# Patient Record
Sex: Female | Born: 1940 | Race: White | Hispanic: No | Marital: Single | Smoking: Never smoker
Health system: Southern US, Community
[De-identification: ages and names within clinical notes are randomized; demographics above are authoritative.]

## PROBLEM LIST (undated history)

## (undated) DIAGNOSIS — D72819 Decreased white blood cell count, unspecified: Secondary | ICD-10-CM

## (undated) DIAGNOSIS — K56600 Partial intestinal obstruction, unspecified as to cause: Secondary | ICD-10-CM

## (undated) DIAGNOSIS — M1990 Unspecified osteoarthritis, unspecified site: Secondary | ICD-10-CM

## (undated) DIAGNOSIS — N361 Urethral diverticulum: Secondary | ICD-10-CM

## (undated) DIAGNOSIS — M47816 Spondylosis without myelopathy or radiculopathy, lumbar region: Secondary | ICD-10-CM

## (undated) DIAGNOSIS — K219 Gastro-esophageal reflux disease without esophagitis: Secondary | ICD-10-CM

## (undated) DIAGNOSIS — R3129 Other microscopic hematuria: Secondary | ICD-10-CM

## (undated) DIAGNOSIS — E039 Hypothyroidism, unspecified: Secondary | ICD-10-CM

## (undated) DIAGNOSIS — N3941 Urge incontinence: Secondary | ICD-10-CM

## (undated) DIAGNOSIS — E079 Disorder of thyroid, unspecified: Secondary | ICD-10-CM

## (undated) DIAGNOSIS — N898 Other specified noninflammatory disorders of vagina: Secondary | ICD-10-CM

## (undated) DIAGNOSIS — K648 Other hemorrhoids: Secondary | ICD-10-CM

## (undated) DIAGNOSIS — I10 Essential (primary) hypertension: Secondary | ICD-10-CM

## (undated) DIAGNOSIS — M5136 Other intervertebral disc degeneration, lumbar region: Secondary | ICD-10-CM

## (undated) DIAGNOSIS — E7800 Pure hypercholesterolemia, unspecified: Secondary | ICD-10-CM

## (undated) DIAGNOSIS — R369 Urethral discharge, unspecified: Secondary | ICD-10-CM

## (undated) DIAGNOSIS — R42 Dizziness and giddiness: Secondary | ICD-10-CM

## (undated) HISTORY — DX: Other intervertebral disc degeneration, lumbar region: M51.36

## (undated) HISTORY — DX: Partial intestinal obstruction, unspecified as to cause: K56.600

## (undated) HISTORY — DX: Decreased white blood cell count, unspecified: D72.819

## (undated) HISTORY — DX: Urethral diverticulum: N36.1

## (undated) HISTORY — PX: BACK SURGERY: SHX140

## (undated) HISTORY — DX: Unspecified osteoarthritis, unspecified site: M19.90

## (undated) HISTORY — DX: Other microscopic hematuria: R31.29

## (undated) HISTORY — DX: Urethral discharge, unspecified: R36.9

## (undated) HISTORY — DX: Other specified noninflammatory disorders of vagina: N89.8

## (undated) HISTORY — PX: ABDOMINAL HYSTERECTOMY: SHX81

## (undated) HISTORY — DX: Urge incontinence: N39.41

## (undated) HISTORY — DX: Other hemorrhoids: K64.8

## (undated) HISTORY — PX: CHOLECYSTECTOMY: SHX55

## (undated) HISTORY — DX: Spondylosis without myelopathy or radiculopathy, lumbar region: M47.816

## (undated) HISTORY — DX: Disorder of thyroid, unspecified: E07.9

## (undated) HISTORY — DX: Pure hypercholesterolemia, unspecified: E78.00

## (undated) HISTORY — DX: Gastro-esophageal reflux disease without esophagitis: K21.9

---

## 2004-10-17 ENCOUNTER — Ambulatory Visit: Payer: Self-pay | Admitting: Unknown Physician Specialty

## 2006-10-09 ENCOUNTER — Ambulatory Visit: Payer: Self-pay | Admitting: Internal Medicine

## 2008-07-14 ENCOUNTER — Ambulatory Visit: Payer: Self-pay | Admitting: Unknown Physician Specialty

## 2013-05-18 DIAGNOSIS — R369 Urethral discharge, unspecified: Secondary | ICD-10-CM

## 2013-05-18 DIAGNOSIS — N3941 Urge incontinence: Secondary | ICD-10-CM

## 2013-05-18 DIAGNOSIS — N361 Urethral diverticulum: Secondary | ICD-10-CM

## 2013-05-18 HISTORY — DX: Urge incontinence: N39.41

## 2013-05-18 HISTORY — DX: Urethral diverticulum: N36.1

## 2013-05-18 HISTORY — DX: Urethral discharge, unspecified: R36.9

## 2013-06-09 ENCOUNTER — Ambulatory Visit: Payer: Self-pay | Admitting: Urology

## 2013-06-24 DIAGNOSIS — N898 Other specified noninflammatory disorders of vagina: Secondary | ICD-10-CM

## 2013-06-24 HISTORY — DX: Other specified noninflammatory disorders of vagina: N89.8

## 2013-08-03 ENCOUNTER — Ambulatory Visit: Payer: Self-pay | Admitting: Unknown Physician Specialty

## 2013-08-04 LAB — PATHOLOGY REPORT

## 2013-12-04 LAB — COMPREHENSIVE METABOLIC PANEL
ALBUMIN: 3.9 g/dL (ref 3.4–5.0)
ALK PHOS: 84 U/L
ALT: 28 U/L
AST: 19 U/L (ref 15–37)
Anion Gap: 8 (ref 7–16)
BUN: 14 mg/dL (ref 7–18)
Bilirubin,Total: 1 mg/dL (ref 0.2–1.0)
CALCIUM: 8.5 mg/dL (ref 8.5–10.1)
CHLORIDE: 106 mmol/L (ref 98–107)
Co2: 30 mmol/L (ref 21–32)
Creatinine: 0.85 mg/dL (ref 0.60–1.30)
EGFR (Non-African Amer.): >60
GLUCOSE: 92 mg/dL (ref 65–99)
Osmolality: 287 (ref 275–301)
Potassium: 3.1 mmol/L — ABNORMAL LOW (ref 3.5–5.1)
Sodium: 144 mmol/L (ref 136–145)
Total Protein: 7 g/dL (ref 6.4–8.2)

## 2013-12-04 LAB — LIPASE, BLOOD: LIPASE: 146 U/L (ref 73–393)

## 2013-12-04 LAB — URINALYSIS, COMPLETE
Bacteria: NONE SEEN
Bilirubin,UR: NEGATIVE
GLUCOSE, UR: NEGATIVE mg/dL (ref 0–75)
Ketone: NEGATIVE
Nitrite: NEGATIVE
Ph: 6 (ref 4.5–8.0)
Protein: NEGATIVE
SPECIFIC GRAVITY: 1.009 (ref 1.003–1.030)

## 2013-12-04 LAB — CBC
HCT: 42.3 % (ref 35.0–47.0)
HGB: 14.2 g/dL (ref 12.0–16.0)
MCH: 31.3 pg (ref 26.0–34.0)
MCHC: 33.6 g/dL (ref 32.0–36.0)
MCV: 93 fL (ref 80–100)
PLATELETS: 203 10*3/uL (ref 150–440)
RBC: 4.54 10*6/uL (ref 3.80–5.20)
RDW: 13.4 % (ref 11.5–14.5)
WBC: 6 10*3/uL (ref 3.6–11.0)

## 2013-12-04 LAB — TROPONIN I: Troponin-I: <0.02 ng/mL

## 2013-12-05 ENCOUNTER — Inpatient Hospital Stay: Payer: Self-pay | Admitting: Internal Medicine

## 2013-12-05 LAB — TROPONIN I
Troponin-I: 0.09 ng/mL — ABNORMAL HIGH
Troponin-I: 0.08 ng/mL — ABNORMAL HIGH

## 2013-12-05 LAB — MAGNESIUM: Magnesium: 1.7 mg/dL — ABNORMAL LOW

## 2013-12-05 LAB — CK-MB
CK-MB: 2 ng/mL (ref 0.5–3.6)
CK-MB: 2.4 ng/mL (ref 0.5–3.6)
CK-MB: 1.7 ng/mL (ref 0.5–3.6)

## 2013-12-05 LAB — POTASSIUM: Potassium: 4.3 mmol/L (ref 3.5–5.1)

## 2013-12-06 LAB — CBC WITH DIFFERENTIAL/PLATELET
BASOS ABS: 0 10*3/uL (ref 0.0–0.1)
Basophil %: 0.2 %
Eosinophil #: 0 10*3/uL (ref 0.0–0.7)
Eosinophil %: 0.1 %
HCT: 38.7 % (ref 35.0–47.0)
HGB: 13.3 g/dL (ref 12.0–16.0)
LYMPHS ABS: 0.4 10*3/uL — AB (ref 1.0–3.6)
Lymphocyte %: 6.6 %
MCH: 31.7 pg (ref 26.0–34.0)
MCHC: 34.4 g/dL (ref 32.0–36.0)
MCV: 92 fL (ref 80–100)
MONO ABS: 0.2 x10 3/mm (ref 0.2–0.9)
Monocyte %: 2.7 %
Neutrophil #: 5.4 10*3/uL (ref 1.4–6.5)
Neutrophil %: 90.4 %
Platelet: 208 10*3/uL (ref 150–440)
RBC: 4.19 10*6/uL (ref 3.80–5.20)
RDW: 13.3 % (ref 11.5–14.5)
WBC: 5.9 10*3/uL (ref 3.6–11.0)

## 2013-12-06 LAB — COMPREHENSIVE METABOLIC PANEL
ANION GAP: 6 — AB (ref 7–16)
Albumin: 3.2 g/dL — ABNORMAL LOW (ref 3.4–5.0)
Alkaline Phosphatase: 62 U/L
BILIRUBIN TOTAL: 1 mg/dL (ref 0.2–1.0)
BUN: 7 mg/dL (ref 7–18)
CHLORIDE: 109 mmol/L — AB (ref 98–107)
Calcium, Total: 8 mg/dL — ABNORMAL LOW (ref 8.5–10.1)
Co2: 27 mmol/L (ref 21–32)
Creatinine: 0.79 mg/dL (ref 0.60–1.30)
EGFR (Non-African Amer.): >60
Glucose: 162 mg/dL — ABNORMAL HIGH (ref 65–99)
Osmolality: 285 (ref 275–301)
Potassium: 4.4 mmol/L (ref 3.5–5.1)
SGOT(AST): 45 U/L — ABNORMAL HIGH (ref 15–37)
SGPT (ALT): 60 U/L
Sodium: 142 mmol/L (ref 136–145)
TOTAL PROTEIN: 6.1 g/dL — AB (ref 6.4–8.2)

## 2013-12-06 LAB — MAGNESIUM: MAGNESIUM: 2.3 mg/dL

## 2014-06-10 NOTE — Op Note (Signed)
PATIENT NAME:  Laurie [name], COTTAM MR#:  [id number] DATE OF BIRTH:  10-20-1940  DATE OF PROCEDURE:  12/05/2013  PREOPERATIVE DIAGNOSES: Acute cholecystitis, cholelithiasis.   POSTOPERATIVE DIAGNOSES: Acute cholecystitis, cholelithiasis.   PROCEDURE: Laparoscopic cholecystectomy, cholangiogram.   SURGEON: Dr. Renda Rolls.   ANESTHESIA: General.   INDICATIONS: This 74 year old female was admitted with acute onset of epigastric pain. She had ultrasound findings of gallstones. She had right upper quadrant tenderness. Surgery was recommended for definitive treatment.   DESCRIPTION OF PROCEDURE: The patient was placed on the operating table in the supine position under general endotracheal anesthesia. The abdomen was prepared with ChloraPrep and draped in a sterile manner. A short incision was made in the inferior aspect of the umbilicus and carried down to the deep fascia which was grasped with laryngeal hook and elevated. A Veress needle was inserted, aspirated, and irrigated with a saline solution. Next, the peritoneal cavity was inflated with carbon dioxide. The Veress needle was removed. The 10-mm cannula was inserted. The 10-mm 0-degree laparoscope was inserted to view the peritoneal cavity. The liver had a smooth surface and appeared that it may have some mild fatty infiltration. Survey of the rest of the abdomen did demonstrate there were a few adhesions between the omentum and the lower abdominal wall. Next, another incision was made in the epigastrium, slightly to the right of the midline to introduce an 11-mm cannula. Two incisions were made in the lateral aspect of the right upper quadrant to introduce two 5-mm cannulas.   The gallbladder was found to be markedly distended acutely inflamed and had a hemorrhagic appearance. It was decompressed with a lancing needle draining clear light green bile. Next, the gallbladder was retracted towards the right shoulder. The infundibulum was retracted  inferiorly and laterally. The gallbladder neck was mobilized with incision of the visceral peritoneum. There was edema in the wall of the gallbladder, which was extensive. The cystic duct was dissected free from surrounding structures. The cystic artery was dissected free from surrounding structures. The porta hepatis was noted to be covered with fat. A critical view of safety was demonstrated. An Endo Clip was placed across the cystic duct adjacent to the neck of the gallbladder. An incision was made in the cystic duct to introduce a Reddick catheter. Half-strength Conray-60 dye was injected as the cholangiogram was done with fluoroscopy. This demonstrated a mildly dilated bile duct. No stones were seen. There was prompt flow of dye into the duodenum. The Reddick catheter was removed. The cystic duct was doubly ligated with Endo Clips and divided. The cystic artery was controlled with double Endo Clips and divided. The gallbladder was dissected free from the liver with hook and cautery. There was extensive edema. Further noted hemostasis was intact. The gallbladder was placed into an Endo Catch bag and brought up through the infraumbilical port site, opened, and suctioned. A gallstone was removed in a piecemeal fashion. It was necessary to lengthen the skin incision and the fascial incision by about 6 mm and the gallbladder was removed and submitted in formalin with stones for routine pathology. The right upper quadrant was further inspected. Hemostasis was intact. The cannulas were removed. Carbon dioxide allowed to escape from the peritoneal cavity. The fascial defect at the umbilicus was closed with 0 Maxon figure-of-8 suture. The skin incisions were closed with interrupted 5-0 chromic subcuticular suture, benzoin, and Steri-Strips. Dressings were applied with paper tape. The patient tolerated surgery satisfactorily and was then prepared for transfer to the recovery  room.    ____________________________ Shela CommonsJ.  Renda RollsWilton Safia Panzer, MD jws:lt D: 12/05/2013 16:49:30 ET T: 12/05/2013 22:00:24 ET JOB#: [id number]  cc: Adella HareJ. Wilton Larsen Zettel, MD, <Dictator> Adella HareWILTON J Jakyiah Briones MD ELECTRONICALLY SIGNED 12/09/2013 19:45

## 2014-06-10 NOTE — Consult Note (Signed)
   Present Illness 74 yo female with no prior cardiac history who was admited after developing epigastric and ruq pain with radiation to her mid scapular region. SHe had significant nausea and vomiting with mildly elevated serum troponin of 0.09. EKG was unremarkable and echo revealed normal lv funciton with no wall motion abnormalities. Abdominal ct suggested acute cholecystitis.   Physical Exam:  GEN no acute distress   HEENT PERRL   NECK supple  No masses   RESP normal resp effort  clear BS   CARD Regular rate and rhythm   ABD positive tenderness  positive Flank Tenderness   LYMPH negative neck, negative axillae   EXTR negative cyanosis/clubbing, negative edema   SKIN normal to palpation   NEURO cranial nerves intact, motor/sensory function intact   PSYCH A+O to time, place, person   Review of Systems:  Subjective/Chief Complaint epigastric and abdominal pain   General: Fatigue   Skin: No Complaints   ENT: No Complaints   Eyes: No Complaints   Neck: No Complaints   Respiratory: No Complaints   Cardiovascular: No Complaints   Gastrointestinal: Heartburn  Nausea  abdominal pain   Genitourinary: No Complaints   Vascular: No Complaints   Musculoskeletal: No Complaints   Neurologic: No Complaints   Hematologic: No Complaints   Endocrine: No Complaints   Psychiatric: No Complaints   Review of Systems: All other systems were reviewed and found to be negative   Medications/Allergies Reviewed Medications/Allergies reviewed   EKG:  EKG NSR    No Known Allergies:    Impression 74 yo female with hisotry of no prior cardiac disease who was admitted after developing epigastric and ruq abdominal pain with radiation to her back. Mild tropoinin eelvation appears to be demand ischemia due to normal ekg and normal echo. Pain is likely secondary to acute cholescystitis. Pt is at low risk for surgery from cardiac standpoint and may proceed iwth routine cardiac  monitoring and without futher cardiac work up.   Plan 1. Proceed with cholecystectomy as planned with routine cardiac monitoring as patient appears to be at low risk from cardiac standpoint. 2. Emperic antibiotics. 3. Pt does not require further preop cardiac workup.   Electronic Signatures: Dalia HeadingFath, Annette Liotta A (MD)  (Signed 19-Oct-15 13:33)  Authored: General Aspect/Present Illness, History and Physical Exam, Review of System, EKG , Allergies, Impression/Plan   Last Updated: 19-Oct-15 13:33 by Dalia HeadingFath, Heitor Steinhoff A (MD)

## 2014-06-10 NOTE — H&P (Signed)
PATIENT NAME:  Tiburcio BashGERICKE, JUANITA D MR#:  [id number] DATE OF BIRTH:  [birth year]  DATE OF ADMISSION:  12/04/2013  PRIMARY CARE PHYSICIAN:  Curtis SitesBert J. Klein III, MD   CHIEF COMPLAINT:  Chest pain, abdominal pain.   HISTORY OF PRESENT ILLNESS:  Ms. Tressa BusmanGericke is a 74 year old female with history of hypertension, hyperlipidemia, and hypothyroidism, who comes to the Emergency Department with complaints of pain that started in the epigastric area and radiating into the right upper quadrant. It started at about 6 in the evening. Initially, the patient attributed this to gastroesophageal reflux disease and took some antacids and Pepcid without much improvement. The patient had 2 episodes of vomiting; the last one was with a small amount of blood. As the pain was getting worse, she came to the Emergency Department.   WORKUP IN THE EMERGENCY DEPARTMENT:  CT of the abdomen and pelvis shows no evidence of aortic aneurysm, inflammatory changes around the gallbladder suggesting early acute cholecystitis/cholelithiasis. The patient has normal white blood cell count and normal LFTs. However, on examination, the patient has a positive Murphy sign. On examination, the patient is also found to have mild elevation of the troponin of 0.09.   PAST MEDICAL HISTORY: 1.  Hypertension.  2.  Hyperlipidemia.  3.  Hypothyroidism.   PAST SURGICAL HISTORY: 1.  Back surgery.  2.  Hysterectomy.   ALLERGIES:  No known drug allergies.   HOME MEDICATIONS:   1.  Losartan 50 mg once a day.  2.  Levothyroxine 75 mcg once a day.  3.  Crestor 20 mg once a day.   SOCIAL HISTORY:  No history of smoking. Drinks alcohol once a week. Denies using any illicit drugs. Married and lives with her husband.   FAMILY HISTORY:  Mother died of colon cancer. Father died in World War II; otherwise, multiple family members with multiple cancers.   REVIEW OF SYSTEMS: CONSTITUTIONAL:  Denies any generalized weakness.  EYES:  No change in vision.   EARS, NOSE, AND THROAT:  No change in hearing.  RESPIRATORY:  No cough or shortness of breath.  CARDIOVASCULAR:  Has chest pain.  GASTROINTESTINAL:  Has nausea and pain in the epigastric and right upper quadrant area.  GENITOURINARY:  No dysuria or hematuria.  HEMATOLOGIC:  No easy bruising or bleeding.  SKIN:  No rash or lesions.  ENDOCRINE:  No polyuria or polydipsia.  NEUROLOGIC:  No weakness or numbness in any part of the body.   PHYSICAL EXAMINATION: GENERAL:  This is a well-built, well-nourished, age-appropriate female lying down in the bed, not in distress.  VITAL SIGNS:  Temperature 97.9, pulse 64, blood pressure 179/97, respiratory rate 16, oxygen saturation 100% on room air.  HEENT:  Head is normocephalic and atraumatic. There is no scleral icterus. Conjunctivae are normal. Pupils are equal and react to light. Mucous membranes are moist. No pharyngeal erythema.  NECK:  Supple. No lymphadenopathy. No JVD. No carotid bruit.  CHEST:  Has no focal tenderness.  LUNGS:  Bilaterally clear to auscultation.  HEART:  S1, S2 regular. No murmurs are heard.  ABDOMEN:  Bowel sounds present. Soft. Has pain in the epigastric area and right upper quadrant. Murphy sign positive. Has guarding. No rebound tenderness. I cannot appreciate hepatosplenomegaly.  EXTREMITIES:  No pedal edema. Pulses are 2+.  NEUROLOGIC:  The patient is alert and oriented to place, person, and time. Cranial nerves II through XII are intact. Motor is 5/5 in upper and lower extremities.   LABORATORY DATA:  CBC  and CMP are completely within normal limits. Troponin is less than 0.02. Urinalysis is negative for nitrites and leukocyte esterase.   IMAGING DATA:  CT of the abdomen and pelvis:  No evidence of aortic aneurysm or dissection. No significant vascular occlusion. Inflammatory changes around the gallbladder suggestive of early acute cholecystitis/cholelithiasis.    Ultrasound of the right upper quadrant positive for  gallstones. There is continued clinical suspicion for acute cholecystitis, recommended nuclear scan.   ASSESSMENT AND PLAN:  Ms. Roller is a 74 year old female who comes with chest pain and abdominal pain that started since this evening.   1.  Abdominal pain, highly concerning about acute cholecystitis; however, concerning about mild elevation of the troponin of 0.09. Admit the patient to a monitored bed. Continue to cycle cardiac enzymes x 3. Risk factors:  Age, hypertension, hyperlipidemia. Also, consult cardiology for clearance.  2.  Right upper quadrant pain, very highly concerning about acute cholecystitis. The patient has a positive Murphy sign. Consult general surgery.  3.  Hypertension. We will hold the lisinopril. We will continue to follow up. This could be secondary to pain.  4.  Hypothyroidism. Continue Synthroid.  5.  Keep the patient on deep vein thrombosis prophylaxis with Lovenox.   TIME SPENT:  50 minutes.    ____________________________ Susa Griffins, MD pv:nb D: 12/05/2013 04:31:08 ET T: 12/05/2013 04:57:30 ET JOB#: [id number]  cc: Susa Griffins, MD, <Dictator> Susa Griffins MD ELECTRONICALLY SIGNED 12/17/2013 23:20

## 2014-06-10 NOTE — Consult Note (Signed)
PATIENT NAME:  Laurie [name], Laurie [name] MR#:  [id number] DATE OF BIRTH:  Jun 26, 1940  DATE OF CONSULTATION:  12/05/2013  REFERRING PHYSICIAN:   CONSULTING PHYSICIAN:  Adella HareJ. Wilton Neema Fluegge, MD  HISTORY OF PRESENT ILLNESS: This 74 year old female accompanied by her husband and daughter was referred by Dr. Daniel NonesBert Klein for consultation regarding a chief complaint of epigastric pain. She reports she was well until yesterday evening when she developed acute epigastric pain in the mid epigastrium and right upper quadrant which has radiated through to the interscapular region of the back. The pain was fairly severe and persistent and did have nausea with an episode of vomiting. She reports no chills or fever. She has been voiding satisfactorily and moving her bowels satisfactorily. She was initially evaluated by the Emergency Room staff, had CT findings which raise concern about possible cholecystitis, also had an ultrasound which demonstrated gallstones. She had EKG which was normal.  She was given aspirin 81 mg. She was admitted emergently to the hospital and the telemetry unit. She reports persistence of pain. She has had further study with a echocardiogram which demonstrates good left ventricular ejection fraction.   PAST MEDICAL HISTORY:  1.  Hypertension.  2.  Hyperlipidemia.  3.  Hypothyroidism.   PAST SURGICAL HISTORY:   1.  Laparoscopic hysterectomy which included bilateral oophorectomy.  2.  Has had back surgery as well.   MEDICATIONS: Include losartan 50 mg daily, levothyroxine 75 mcg daily, Crestor 20 mg daily.   SOCIAL HISTORY: She is accompanied by her husband and daughter. Does not smoke. Drinks alcohol about once a week.   FAMILY HISTORY: Positive for colon cancer.   REVIEW OF SYSTEMS: She reports no other recent acute illness such as cough, cold, or sore throat. She reports no recent visual changes. No difficulty swallowing. Does have some history of reflux and heartburn. She reports no exertional  chest pains. No dyspnea.  No recent bowel symptoms. No urinary symptoms. No ankle edema. No recent sores or boils. Review of systems otherwise negative.   PHYSICAL EXAMINATION:  GENERAL: She is awake, alert, and oriented.  VITAL SIGNS: Temperature is 97.5, pulse 58, respirations 18, blood pressure 160/77, pulse oximetry 97%.  SKIN: Warm and dry without rash or jaundice.  HEENT: Pupils equal, reactive to light. Extraocular movements are intact. Sclerae clear. Pharynx clear.  NECK: No palpable mass.  LUNGS: Sounds were clear.  HEART: Regular rhythm, S1 and S2.  ABDOMEN:  Mild right upper quadrant tenderness with positive Murphy sign. No palpable mass. No hepatomegaly.  EXTREMITIES: No pedal edema.  NEUROLOGIC: Awake, alert, oriented, moving all extremities.   CLINICAL DATA: Her potassium on admission was 3.1, subsequent potassium was up to 4.3, magnesium is low at 1.7. Liver panel normal. Creatinine 0.85. Troponin is 0.08. CBC with a white blood count 6000, hemoglobin 14.2, platelet count 203,000. Echocardiogram with minimal mitral regurgitation.   IMPRESSION: Cholecystitis, cholelithiasis.   PLAN:  I recommended laparoscopic cholecystectomy. I have discussed the operation, care, risks, and benefits with her in detail and we will get this scheduled.     ____________________________ Shela CommonsJ. Renda RollsWilton Hellen Shanley, MD jws:bu [name]: 12/05/2013 13:17:46 ET T: 12/05/2013 14:30:20 ET JOB#: [id number]  cc: Adella HareJ. Wilton Harkirat Orozco, MD, <Dictator> Adella HareWILTON J Deliyah Muckle MD ELECTRONICALLY SIGNED 12/09/2013 19:44

## 2014-06-12 LAB — SURGICAL PATHOLOGY

## 2015-09-24 DIAGNOSIS — M47816 Spondylosis without myelopathy or radiculopathy, lumbar region: Secondary | ICD-10-CM

## 2015-09-24 HISTORY — DX: Spondylosis without myelopathy or radiculopathy, lumbar region: M47.816

## 2015-10-13 IMAGING — CT CT ANGIO CHEST-ABD-PELV
2 of 6 series · 11 of 36 positions shown, 15 images · IV contrast (APPLIED)
Comparison: None.

CLINICAL DATA: Mid upper abdominal and epigastric region pain
radiating to the back and right upper quadrant abdomen. Vomiting.

EXAM:
CT ANGIOGRAPHY CHEST, ABDOMEN AND PELVIS
TECHNIQUE: Multidetector CT imaging through the chest, abdomen and pelvis was
performed using the standard protocol during bolus administration of
intravenous contrast. Multiplanar reconstructed images and MIPs were
obtained and reviewed to evaluate the vascular anatomy.
CONTRAST:  125 mL Isovue 370

[Series 6: arterial · axial · arterial · 0.73mm/px · z∈[-665,-111]mm · 10 of 322 slices shown, 13 images]
[im 30/322  mediastinal]
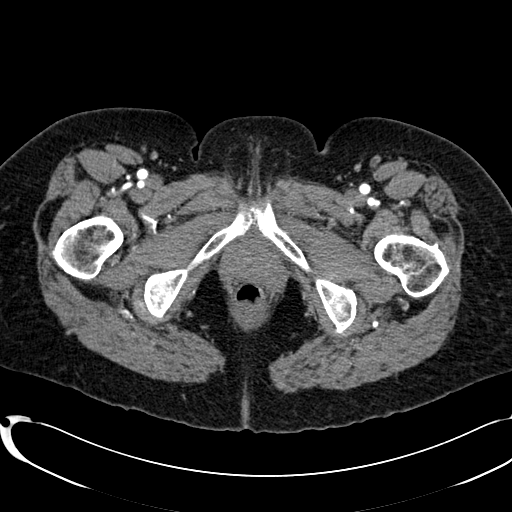
[im 30/322  bone]
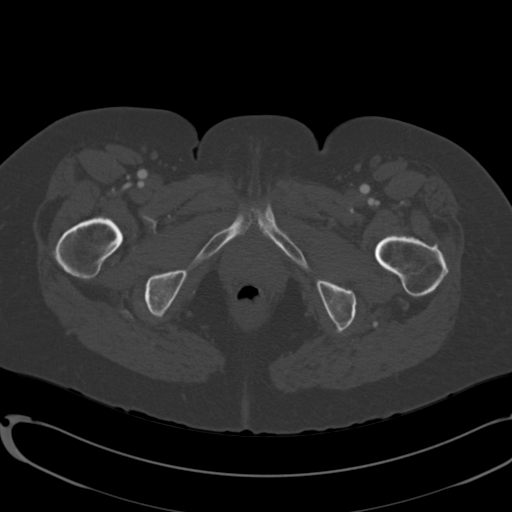
[im 59/322  mediastinal]
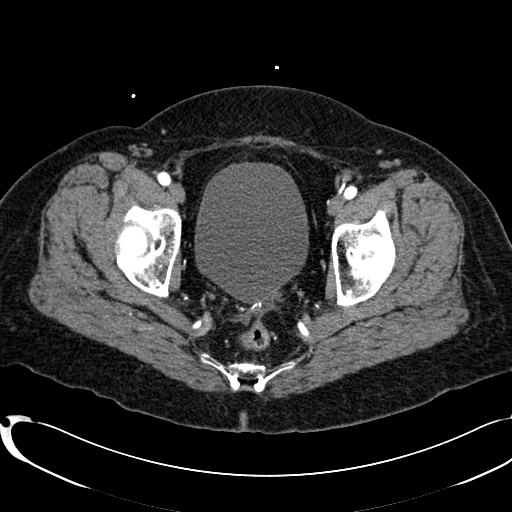
[im 103/322  mediastinal]
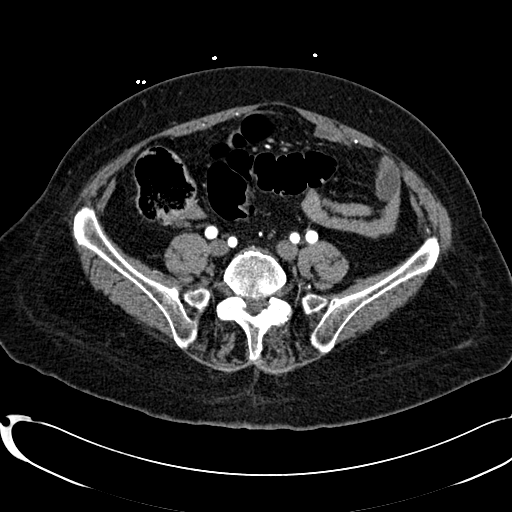
[im 146/322  mediastinal]
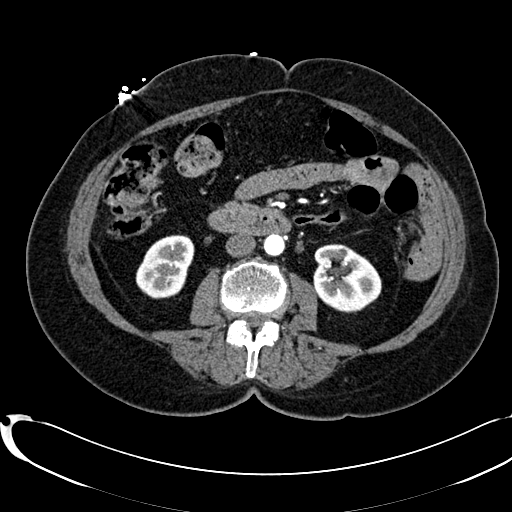
[im 176/322  mediastinal]
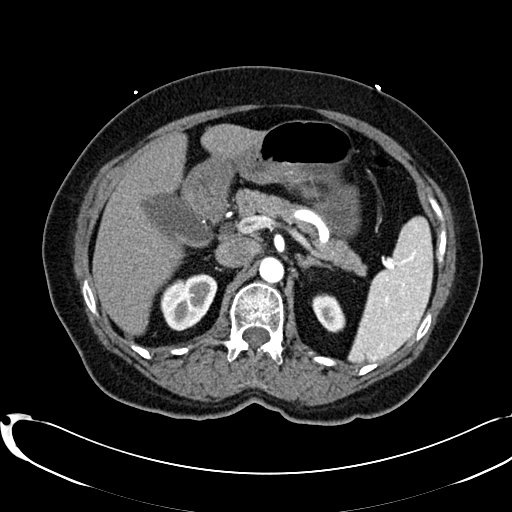
[im 219/322  mediastinal]
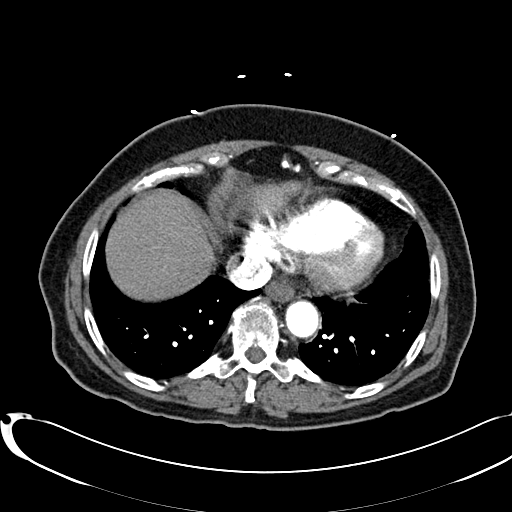
[im 263/322  mediastinal]
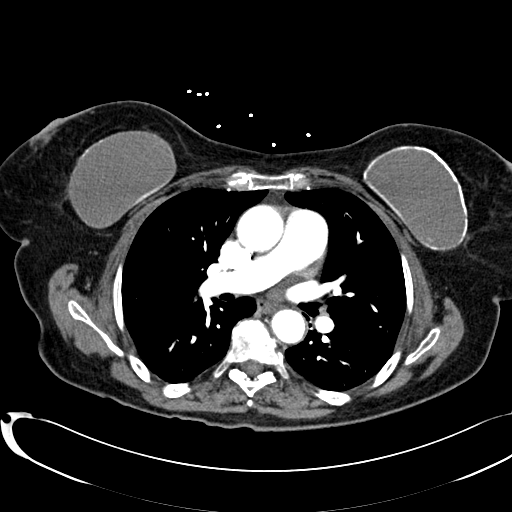
[im 263/322  lung]
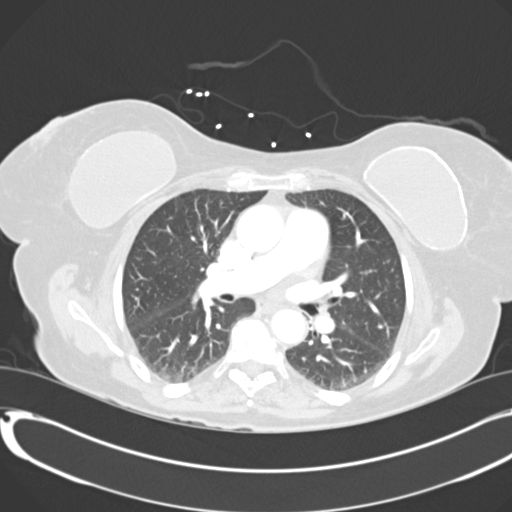
[im 278/322  lung]
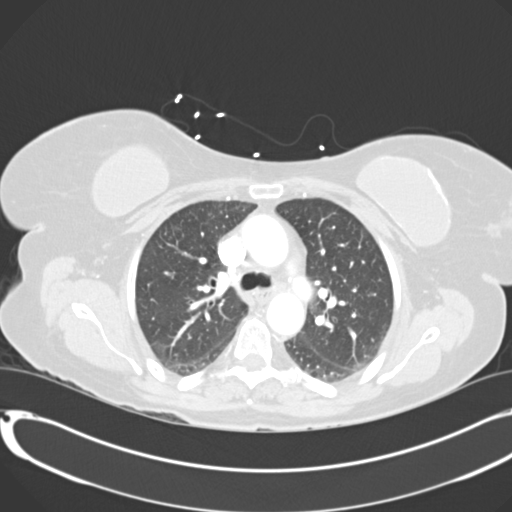
[im 292/322  mediastinal]
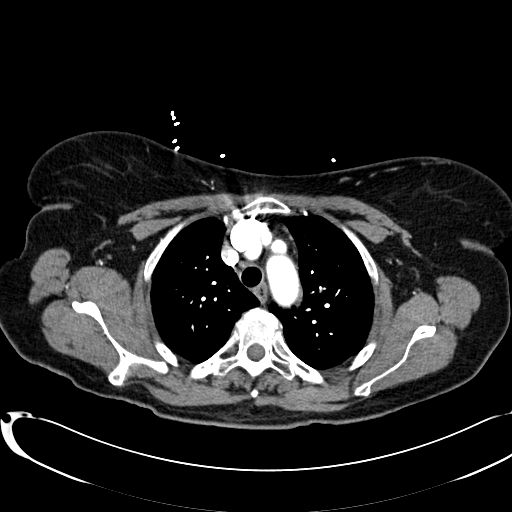
[im 292/322  lung]
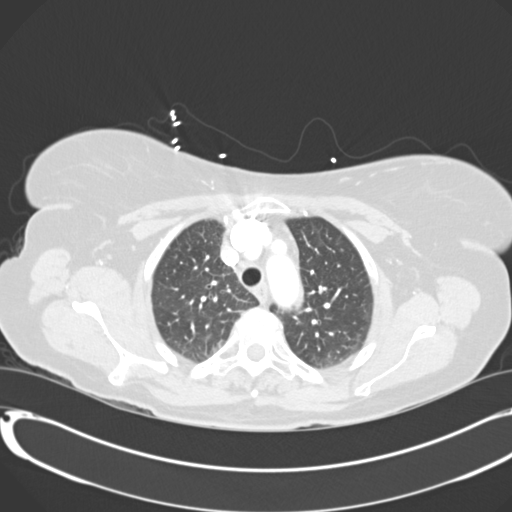
[im 307/322  lung]
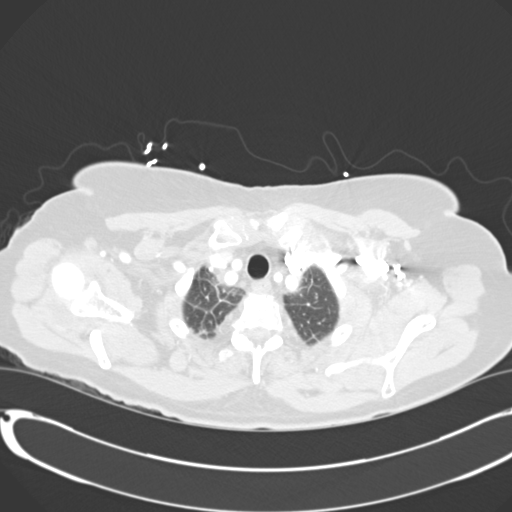

[Series 8: cor arterial mpr · coronal · arterial · 0.73mm/px · 1 of 119 slices shown, 2 images]
[im 60/119  mediastinal]
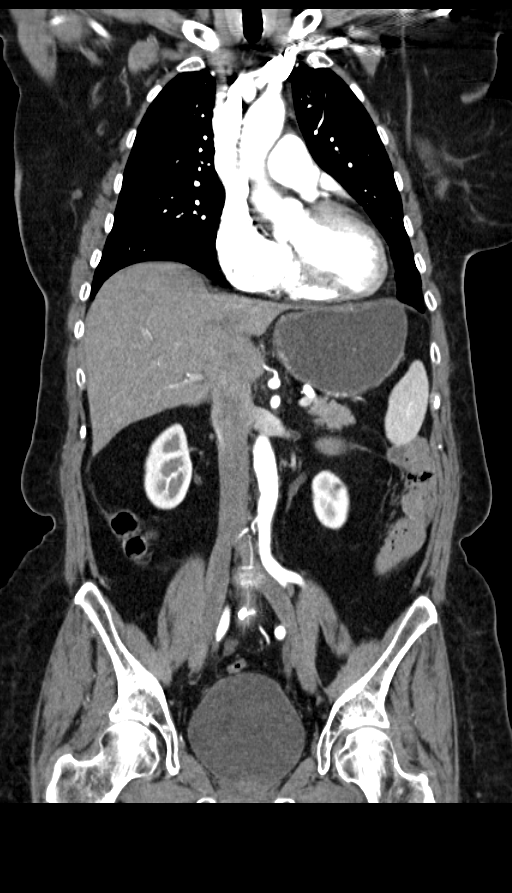
[im 60/119  bone]
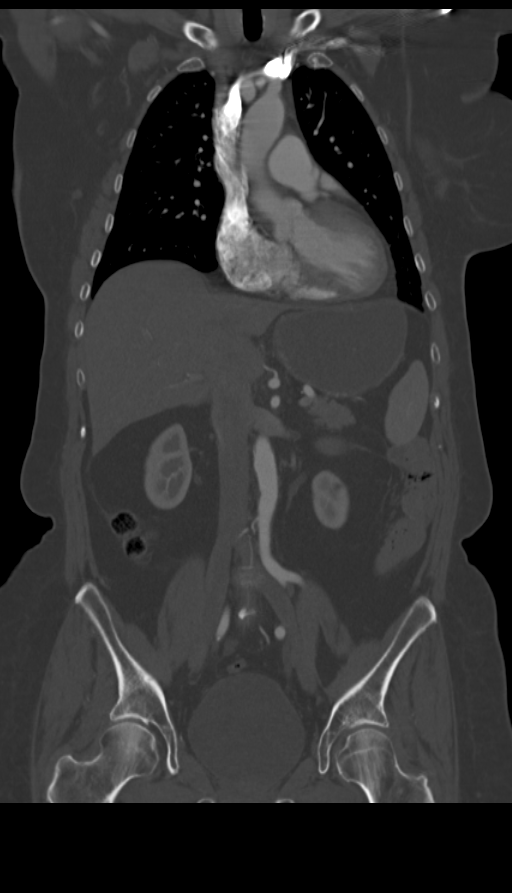

[11 of 36 positions shown; findings below may reference images not displayed]

FINDINGS: CTA CHEST FINDINGS

Unenhanced images of the chest demonstrate scattered calcification
in the thoracic aorta. No intramural hematoma. Small calcified lymph
nodes in the mediastinum. Calcified granulomas in the right lung.
Bilateral breast implants with calcification of the implant walls.

Arterial phase contrast enhanced images demonstrate normal
opacification of the thoracic aorta. No evidence of aneurysm or
dissection. Great vessel origins are patent. Visualized central
pulmonary arteries are patent. No evidence of significant pulmonary
embolus.

Normal heart size. Esophagus is decompressed. Small esophageal
hiatal hernia. Dependent atelectasis in the lungs. No focal airspace
disease or consolidation. Airways appear patent. No significant
lymphadenopathy in the chest.

Review of the MIP images confirms the above findings.

CTA ABDOMEN AND PELVIS FINDINGS

Normal caliber abdominal aorta. No evidence of aortic dissection.
The abdominal aorta, celiac axis, superior mesenteric artery, single
bilateral renal arteries, inferior mesenteric artery, and bilateral
iliac, external iliac, internal iliac, and common femoral arteries
are patent. No evidence of significant vascular occlusion. Renal
nephrograms are symmetrical.

Diffuse fatty infiltration of the liver. The gallbladder is mildly
distended with a large stone in the gallbladder. There is mild
infiltration around the gallbladder. This suggest mild changes of
acute cholecystitis. No bile duct dilatation. The pancreas, spleen,
adrenal glands, inferior vena cava, and retroperitoneal lymph nodes
are unremarkable. Subcentimeter cysts in the right kidney. No
hydronephrosis or solid mass in either kidney. Fluid filled stomach
without wall thickening. Small bowel are decompressed. Stool-filled
colon without distention. No free air or free fluid in the abdomen.
Abdominal wall musculature appears intact.

Pelvis: Surgical absence of the uterus. No abnormal pelvic mass or
lymphadenopathy. No inflammatory changes to suggest diverticulitis.
Appendix is normal. No free or loculated pelvic fluid collections.

Bones: Degenerative changes throughout the thoracic and lumbar
spine. No vertebral compression deformities. Sternum and visualized
ribs appear intact. Degenerative changes in the hips. No displaced
fractures identified in the pelvis or sacrum.

Review of the MIP images confirms the above findings.
IMPRESSION: No evidence of aortic aneurysm or dissection. No significant
vascular occlusion. Of incidental note of inflammatory changes
around the gallbladder suggesting early acute cholecystitis.
Cholelithiasis. Small esophageal hiatal hernia.

## 2015-10-13 IMAGING — CR DG CHOLANGIOGRAM OPERATIVE
3 series · 3 of 3 positions shown · non-contrast
Comparison: CT the chest, abdomen pelvis -12/05/2013

CLINICAL DATA: Laparoscopic cholecystectomy.

EXAM:
INTRAOPERATIVE CHOLANGIOGRAM
FLUOROSCOPY TIME:  4 seconds

[[id] (1 of 3)]
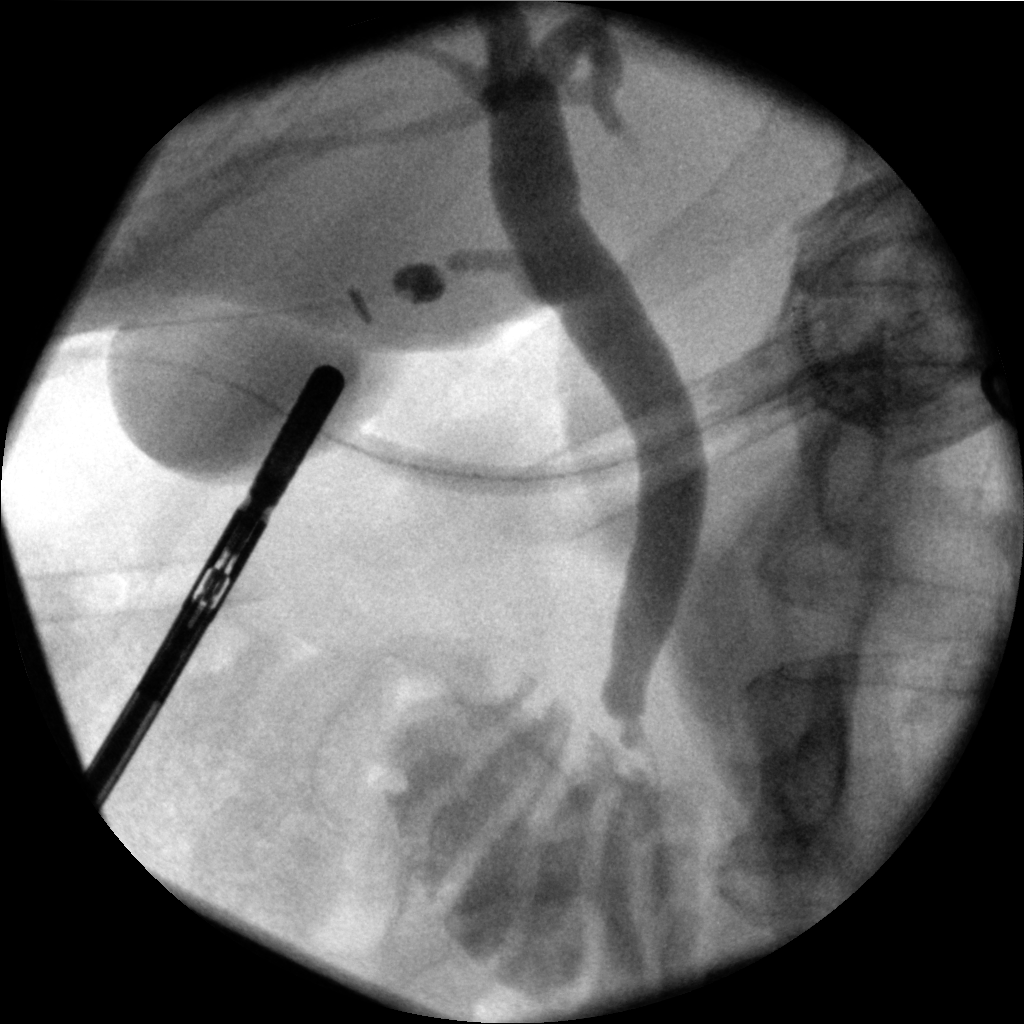

[[id] (2 of 3)]
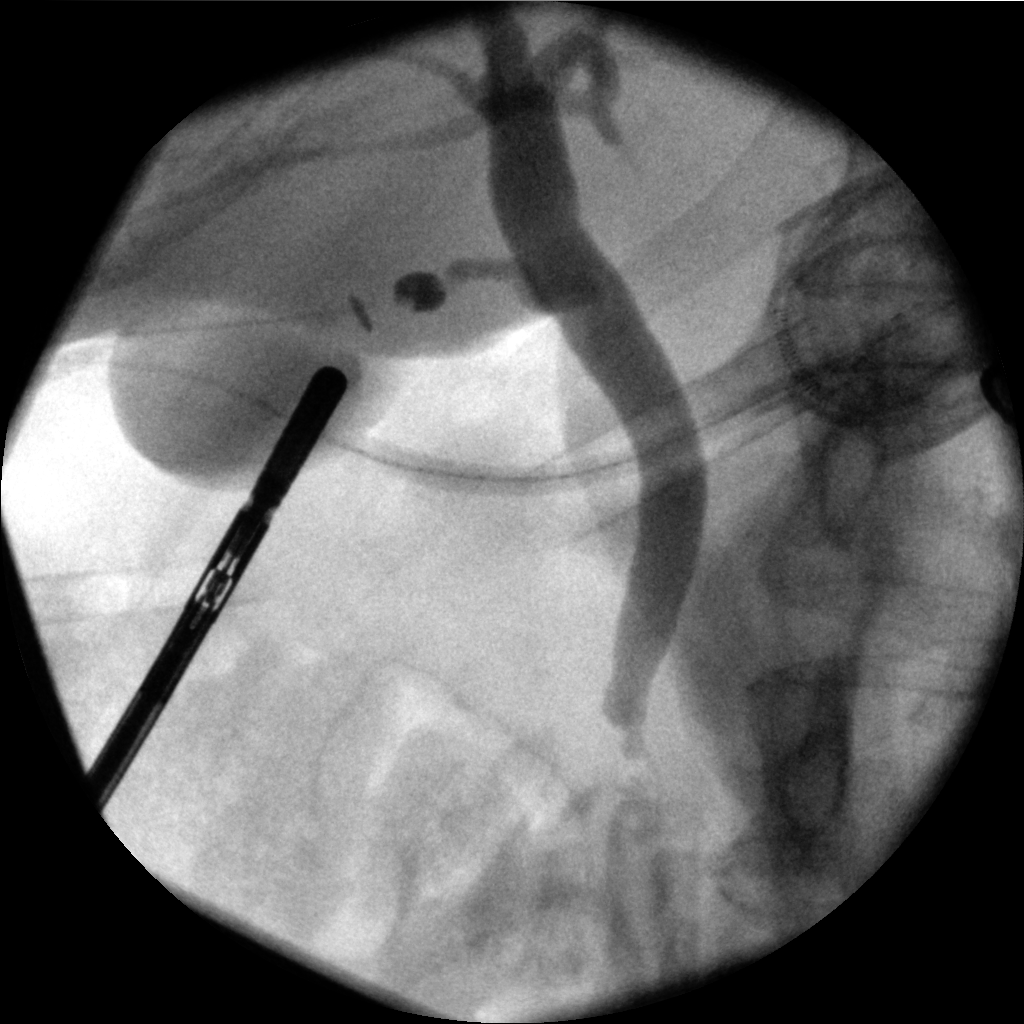

[[id] (3 of 3)]
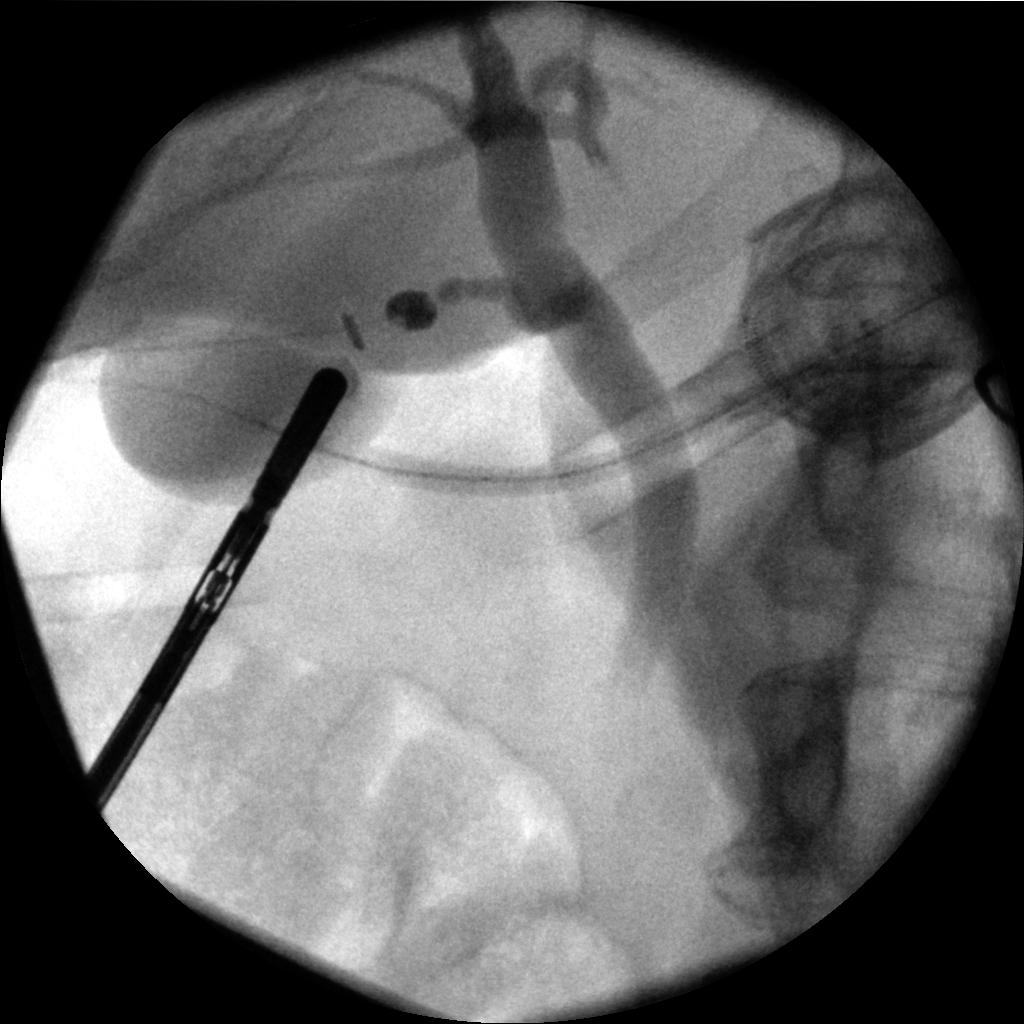

[3 of 3 positions shown; findings below may reference images not displayed]

FINDINGS: Three intraoperative cholangiographic images of the right upper
abdominal quadrant during laparoscopic cholecystectomy are provided
for review.

Surgical clips overlie the expected location of the gallbladder
fossa.

Contrast injection demonstrates selective cannulation of the central
aspect of the cystic duct.

There is passage of contrast through the central aspect of the
cystic duct with filling of a non dilated common bile duct. There is
passage of contrast though the CBD and into the descending portion
of the duodenum.

There is minimal reflux of injected contrast into the common hepatic
duct and central aspect of the non dilated intrahepatic biliary
system.

There are no discrete filling defects within the opacified portions
of the biliary system to suggest the presence of
choledocholithiasis.
IMPRESSION: No evidence of choledocholithiasis.

## 2016-12-11 ENCOUNTER — Encounter: Payer: Self-pay | Admitting: Emergency Medicine

## 2016-12-11 ENCOUNTER — Inpatient Hospital Stay
Admission: EM | Admit: 2016-12-11 | Discharge: 2016-12-15 | DRG: 390 | Disposition: A | Discharge status: Home / Self Care | Payer: Medicare Other | Attending: General Surgery | Admitting: General Surgery

## 2016-12-11 ENCOUNTER — Emergency Department: Payer: Medicare Other

## 2016-12-11 DIAGNOSIS — E785 Hyperlipidemia, unspecified: Secondary | ICD-10-CM | POA: Diagnosis present

## 2016-12-11 DIAGNOSIS — K56600 Partial intestinal obstruction, unspecified as to cause: Secondary | ICD-10-CM

## 2016-12-11 DIAGNOSIS — E039 Hypothyroidism, unspecified: Secondary | ICD-10-CM | POA: Diagnosis present

## 2016-12-11 DIAGNOSIS — Z86010 Personal history of colonic polyps: Secondary | ICD-10-CM

## 2016-12-11 DIAGNOSIS — K219 Gastro-esophageal reflux disease without esophagitis: Secondary | ICD-10-CM | POA: Diagnosis present

## 2016-12-11 DIAGNOSIS — Z888 Allergy status to other drugs, medicaments and biological substances status: Secondary | ICD-10-CM

## 2016-12-11 DIAGNOSIS — K5652 Intestinal adhesions [bands] with complete obstruction: Secondary | ICD-10-CM

## 2016-12-11 DIAGNOSIS — M1990 Unspecified osteoarthritis, unspecified site: Secondary | ICD-10-CM | POA: Diagnosis present

## 2016-12-11 DIAGNOSIS — I10 Essential (primary) hypertension: Secondary | ICD-10-CM | POA: Diagnosis present

## 2016-12-11 DIAGNOSIS — Z90710 Acquired absence of both cervix and uterus: Secondary | ICD-10-CM

## 2016-12-11 DIAGNOSIS — Z79899 Other long term (current) drug therapy: Secondary | ICD-10-CM | POA: Diagnosis not present

## 2016-12-11 DIAGNOSIS — R1030 Lower abdominal pain, unspecified: Secondary | ICD-10-CM | POA: Diagnosis present

## 2016-12-11 DIAGNOSIS — K56601 Complete intestinal obstruction, unspecified as to cause: Secondary | ICD-10-CM | POA: Diagnosis not present

## 2016-12-11 HISTORY — DX: Essential (primary) hypertension: I10

## 2016-12-11 HISTORY — DX: Partial intestinal obstruction, unspecified as to cause: K56.600

## 2016-12-11 HISTORY — DX: Hypothyroidism, unspecified: E03.9

## 2016-12-11 LAB — COMPREHENSIVE METABOLIC PANEL
ALK PHOS: 62 U/L (ref 38–126)
ALT: 21 U/L (ref 14–54)
AST: 26 U/L (ref 15–41)
Albumin: 4.7 g/dL (ref 3.5–5.0)
Anion gap: 10 (ref 5–15)
BILIRUBIN TOTAL: 1.4 mg/dL — AB (ref 0.3–1.2)
BUN: 12 mg/dL (ref 6–20)
CALCIUM: 9.7 mg/dL (ref 8.9–10.3)
CO2: 29 mmol/L (ref 22–32)
CREATININE: 0.88 mg/dL (ref 0.44–1.00)
Chloride: 100 mmol/L — ABNORMAL LOW (ref 101–111)
Glucose, Bld: 116 mg/dL — ABNORMAL HIGH (ref 65–99)
Potassium: 4 mmol/L (ref 3.5–5.1)
SODIUM: 139 mmol/L (ref 135–145)
TOTAL PROTEIN: 7.4 g/dL (ref 6.5–8.1)

## 2016-12-11 LAB — CBC
HCT: 44.1 % (ref 35.0–47.0)
Hemoglobin: 14.7 g/dL (ref 12.0–16.0)
MCH: 30.9 pg (ref 26.0–34.0)
MCHC: 33.3 g/dL (ref 32.0–36.0)
MCV: 92.6 fL (ref 80.0–100.0)
PLATELETS: 220 10*3/uL (ref 150–440)
RBC: 4.77 MIL/uL (ref 3.80–5.20)
RDW: 14.2 % (ref 11.5–14.5)
WBC: 6.1 10*3/uL (ref 3.6–11.0)

## 2016-12-11 LAB — URINALYSIS, COMPLETE (UACMP) WITH MICROSCOPIC
Bilirubin Urine: NEGATIVE
Glucose, UA: NEGATIVE mg/dL
Ketones, ur: NEGATIVE mg/dL
NITRITE: NEGATIVE
PH: 7 (ref 5.0–8.0)
Protein, ur: NEGATIVE mg/dL
SPECIFIC GRAVITY, URINE: 1.016 (ref 1.005–1.030)

## 2016-12-11 LAB — LACTIC ACID, PLASMA: Lactic Acid, Venous: 1 mmol/L (ref 0.5–1.9)

## 2016-12-11 LAB — LIPASE, BLOOD: Lipase: 19 U/L (ref 11–51)

## 2016-12-11 MED ORDER — MORPHINE SULFATE (PF) 4 MG/ML IV SOLN
INTRAVENOUS | Status: AC
  Administered 2016-12-11: 4 mg via INTRAVENOUS
  Filled 2016-12-11: qty 1

## 2016-12-11 MED ORDER — IOPAMIDOL (ISOVUE-300) INJECTION 61%
100.0000 mL | Freq: Once | INTRAVENOUS | Status: AC | PRN
  Administered 2016-12-11: 100 mL via INTRAVENOUS

## 2016-12-11 MED ORDER — HYDRALAZINE HCL 20 MG/ML IJ SOLN
10.0000 mg | Freq: Four times a day (QID) | INTRAMUSCULAR | Status: DC | PRN
  Administered 2016-12-11: 10 mg via INTRAVENOUS
  Filled 2016-12-11: qty 1

## 2016-12-11 MED ORDER — SODIUM CHLORIDE 0.9 % IV SOLN
Freq: Once | INTRAVENOUS | Status: AC
  Administered 2016-12-11: 19:00:00 via INTRAVENOUS

## 2016-12-11 MED ORDER — ONDANSETRON HCL 4 MG/2ML IJ SOLN
4.0000 mg | Freq: Four times a day (QID) | INTRAMUSCULAR | Status: DC | PRN
  Administered 2016-12-11 – 2016-12-12 (×2): 4 mg via INTRAVENOUS
  Filled 2016-12-11 (×2): qty 2

## 2016-12-11 MED ORDER — PANTOPRAZOLE SODIUM 40 MG IV SOLR
40.0000 mg | Freq: Every day | INTRAVENOUS | Status: DC
  Administered 2016-12-11 – 2016-12-14 (×4): 40 mg via INTRAVENOUS
  Filled 2016-12-11 (×4): qty 40

## 2016-12-11 MED ORDER — ONDANSETRON HCL 4 MG/2ML IJ SOLN
INTRAMUSCULAR | Status: AC
Start: 2016-12-11 — End: 2016-12-11
  Administered 2016-12-11: 4 mg via INTRAVENOUS
  Filled 2016-12-11: qty 2

## 2016-12-11 MED ORDER — IOPAMIDOL (ISOVUE-300) INJECTION 61%
30.0000 mL | Freq: Once | INTRAVENOUS | Status: AC
  Administered 2016-12-11: 30 mL via ORAL

## 2016-12-11 MED ORDER — HYDROMORPHONE HCL 1 MG/ML IJ SOLN: 0.5000 mg | INTRAMUSCULAR | Status: DC | PRN

## 2016-12-11 MED ORDER — LACTATED RINGERS IV SOLN
INTRAVENOUS | Status: DC
Start: 2016-12-11 — End: 2016-12-13
  Administered 2016-12-11 – 2016-12-13 (×3): via INTRAVENOUS

## 2016-12-11 MED ORDER — LEVOTHYROXINE SODIUM 100 MCG IV SOLR
37.5000 ug | Freq: Every day | INTRAVENOUS | Status: DC
  Administered 2016-12-12 – 2016-12-15 (×4): 37.5 ug via INTRAVENOUS
  Filled 2016-12-11 (×4): qty 5

## 2016-12-11 MED ORDER — MORPHINE SULFATE (PF) 4 MG/ML IV SOLN
INTRAVENOUS | Status: AC
  Filled 2016-12-11: qty 1

## 2016-12-11 MED ORDER — ONDANSETRON 4 MG PO TBDP: 4.0000 mg | ORAL_TABLET | Freq: Four times a day (QID) | ORAL | Status: DC | PRN

## 2016-12-11 MED ORDER — MORPHINE SULFATE (PF) 4 MG/ML IV SOLN
4.0000 mg | Freq: Once | INTRAVENOUS | Status: AC
  Administered 2016-12-11: 4 mg via INTRAVENOUS

## 2016-12-11 MED ORDER — ONDANSETRON HCL 4 MG/2ML IJ SOLN
4.0000 mg | Freq: Once | INTRAMUSCULAR | Status: AC
  Administered 2016-12-11: 4 mg via INTRAVENOUS

## 2016-12-11 MED ORDER — ENOXAPARIN SODIUM 40 MG/0.4ML ~~LOC~~ SOLN
40.0000 mg | SUBCUTANEOUS | Status: DC
  Administered 2016-12-12: 40 mg via SUBCUTANEOUS
  Filled 2016-12-11 (×2): qty 0.4

## 2016-12-11 MED ORDER — KETOROLAC TROMETHAMINE 15 MG/ML IJ SOLN
15.0000 mg | Freq: Four times a day (QID) | INTRAMUSCULAR | Status: DC | PRN
  Administered 2016-12-11: 15 mg via INTRAVENOUS
  Filled 2016-12-11 (×2): qty 1

## 2016-12-11 NOTE — ED Provider Notes (Signed)
[facility] Emergency Department Provider Note   ____________________________________________   I have reviewed the triage vital signs and the nursing notes.   HISTORY  Chief Complaint Abdominal Pain   History limited by: Not Limited   HPI Laurie [name] is a 76 y.o. female who presents to the emergency department today because of concern for abdominal pain.  LOCATION:initially lower abdomen, at time of my exam more in the upper abdomen DURATION:started this afternoon TIMING: has been constant and worsening SEVERITY: severe QUALITY: burning, cramping CONTEXT: patient states she was outside doing yard work when the pain started. She had been bending over and weeding. Had a normal bowel movement earlier in the day. Had a small bowel movement after the pain started.  MODIFYING FACTORS: tried drinking a carbonated beverage which did not help ASSOCIATED SYMPTOMS: patient feels she has had some distention. no nausea or emesis. No fevers.   Per medical record review patient has a history of hysterectomy and cholecystectomy.  Past Medical History:  Diagnosis Date  . Hypertension   . Hypothyroid     There are no active problems to display for this patient.   Past Surgical History:  Procedure Laterality Date  . ABDOMINAL HYSTERECTOMY    . BACK SURGERY    . CHOLECYSTECTOMY      Prior to Admission medications   Not on File    Allergies Patient has no known allergies.  No family history on file.  Social History Social History  Substance Use Topics  . Smoking status: Never Smoker  . Smokeless tobacco: Never Used  . Alcohol use Yes    Review of Systems Constitutional: No fever/chills Eyes: No visual changes. ENT: No sore throat. Cardiovascular: Denies chest pain. Respiratory: Denies shortness of breath. Gastrointestinal: Positive for abdominal pain. Genitourinary: Negative for dysuria. Musculoskeletal: Negative for back pain. Skin:  Negative for rash. Neurological: Negative for headaches, focal weakness or numbness.  ____________________________________________   PHYSICAL EXAM:  VITAL SIGNS: ED Triage Vitals  Enc Vitals Group     BP 12/11/16 1743 (!) 183/107     Pulse Rate 12/11/16 1743 95     Resp 12/11/16 1743 18     Temp 12/11/16 1743 97.6 F (36.4 C)     Temp Source 12/11/16 1743 Oral     SpO2 12/11/16 1743 96 %     Weight 12/11/16 1744 160 lb (72.6 kg)     Height 12/11/16 1744 5\' 3"  (1.6 m)     Head Circumference --      Peak Flow --      Pain Score 12/11/16 1743 9    Constitutional: Alert and oriented. Well appearing and in no distress. Eyes: Conjunctivae are normal.  ENT   Head: Normocephalic and atraumatic.   Nose: No congestion/rhinnorhea.   Mouth/Throat: Mucous membranes are moist.   Neck: No stridor. Hematological/Lymphatic/Immunilogical: No cervical lymphadenopathy. Cardiovascular: Normal rate, regular rhythm.  No murmurs, rubs, or gallops.  Respiratory: Normal respiratory effort without tachypnea nor retractions. Breath sounds are clear and equal bilaterally. No wheezes/rales/rhonchi. Gastrointestinal: Soft. No tympany. Tender to palpation, primarily in the upper abdomen.  Genitourinary: Deferred Musculoskeletal: Normal range of motion in all extremities. No lower extremity edema. Neurologic:  Normal speech and language. No gross focal neurologic deficits are appreciated.  Skin:  Skin is warm, dry and intact. No rash noted. Psychiatric: Mood and affect are normal. Speech and behavior are normal. Patient exhibits appropriate insight and judgment.  ____________________________________________    LABS (pertinent positives/negatives)  Lactic acid 1.0 Lipase 19 CMP tot b 1.4, cl 100, glu 116 otherwise wnl UA WBC too numerous to count CBC wbc 6.1, hgb 14.7  ____________________________________________   EKG  I, Phineas SemenGraydon Cyncere Sontag, attending physician, personally viewed  and interpreted this EKG  EKG Time: 1752 Rate: 72 Rhythm: normal sinus rhythm Axis: normal Intervals: qtc 398 QRS: narrow ST changes: no st elevation, t wave inversion V1, V2 Impression: abnormal ekg   ____________________________________________    RADIOLOGY  CT abd/pel Concern for possible early partial obstruction   ____________________________________________   PROCEDURES  Procedures  ____________________________________________   INITIAL IMPRESSION / ASSESSMENT AND PLAN / ED COURSE  Pertinent labs & imaging results that were available during my care of the patient were reviewed by me and considered in my medical decision making (see chart for details).  Differential diagnosis includes, but is not limited to, acute appendicitis, diverticulitis, urinary tract infection/pyelonephritis, endometriosis, bowel obstruction, colitis, renal colic, gastroenteritis, hernia, etc. Given history of surgery concern for obstruction was high. CT scan was obtained which showed possible early obstruction. Discussed my concern and the findings with the patient. Discussed with Dr. Aleen CampiPiscoya who evaluated the patient.  He will admit to the hospital.   ____________________________________________   FINAL CLINICAL IMPRESSION(S) / ED DIAGNOSES  Final diagnoses:  Partial small bowel obstruction (HCC)     Note: This dictation was prepared with Dragon dictation. Any transcriptional errors that result from this process are unintentional     Phineas SemenGoodman, Zoiey Christy, MD 12/11/16 2310

## 2016-12-11 NOTE — H&P (Signed)
Date of Admission:  12/11/2016  Reason for Admission:  Abdominal pain  History of Present Illness: Laurie [name] is a 76 y.o. female who presents with a one-day history of abdominal pain.  Patient was in her usual state of health yesterday and today until about 2:30 pm.  She was outside doing yard work and started having low abdominal pain, with feeling of worsening bloatedness.  She describes her pain starting in the epigastric area and radiating inferiorly.  However, now her pain is mostly in the upper abdomen.  Denies having any nausea or vomiting.  She had a bowel movement this morning and a smaller one this afternoon.  Denies having any flatus today.  She had tried walking to help with the pain but this has not improved.  She has received two doses of Morphine in the ED and reports this has improved her pain somewhat.  Past Medical History: Past Medical History:  Diagnosis Date  . Hypertension   . Hypothyroid Colon Polyps Dysphagia H/o gastritis Hyperlipidemia DJD / DDD      Past Surgical History: Past Surgical History:  Procedure Laterality Date  . ABDOMINAL HYSTERECTOMY    . BACK SURGERY    . CHOLECYSTECTOMY Urethral surgeries Colonoscopies      Home Medications: Prior to Admission medications   --Levothyroxine 75 mcg daily --Losartan 50 mg daily    Allergies: --Amlodipine  Social History:  reports that she has never smoked. She has never used smokeless tobacco. She reports that she drinks alcohol. She reports that she does not use drugs.   Family History: --colon cancer (mother)  Review of Systems: Review of Systems  Constitutional: Negative for chills and fever.  HENT: Negative for hearing loss.   Eyes: Negative for blurred vision.  Respiratory: Negative for cough.   Cardiovascular: Negative for chest pain.  Gastrointestinal: Positive for abdominal pain. Negative for constipation, diarrhea, nausea and vomiting.  Genitourinary: Negative for dysuria.   Musculoskeletal: Negative for myalgias.  Skin: Negative for rash.  Neurological: Negative for dizziness.  Psychiatric/Behavioral: Negative for depression.  All other systems reviewed and are negative.   Physical Exam BP (!) 170/97 (BP Location: Right Arm)   Pulse 63   Temp 97.6 F (36.4 C) (Oral)   Resp 18   Ht 5\' 3"  (1.6 m)   Wt 72.6 kg (160 lb)   SpO2 97%   BMI 28.34 kg/m  CONSTITUTIONAL: No acute distress HEENT:  Normocephalic, atraumatic, extraocular motion intact. NECK: Trachea is midline, and there is no jugular venous distension.  RESPIRATORY:  Lungs are clear, and breath sounds are equal bilaterally. Normal respiratory effort without pathologic use of accessory muscles. CARDIOVASCULAR: Heart is regular without murmurs, gallops, or rubs. GI: The abdomen is soft, mildly distended, with tenderness to palpation mostly over mid abdomen and with some discomfort over right lower quadrant. There were no palpable masses.  MUSCULOSKELETAL:  Normal muscle strength and tone in all four extremities.  No peripheral edema or cyanosis. SKIN: Skin turgor is normal. There are no pathologic skin lesions.  NEUROLOGIC:  Motor and sensation is grossly normal.  Cranial nerves are grossly intact. PSYCH:  Alert and oriented to person, place and time. Affect is normal.  Laboratory Analysis: Results for orders placed or performed during the hospital encounter of 12/11/16 (from the past 24 hour(s))  Lipase, blood     Status: None   Collection Time: 12/11/16  5:46 PM  Result Value Ref Range   Lipase 19 11 - 51 U/L  Comprehensive metabolic panel     Status: Abnormal   Collection Time: 12/11/16  5:46 PM  Result Value Ref Range   Sodium 139 135 - 145 mmol/L   Potassium 4.0 3.5 - 5.1 mmol/L   Chloride 100 (L) 101 - 111 mmol/L   CO2 29 22 - 32 mmol/L   Glucose, Bld 116 (H) 65 - 99 mg/dL   BUN 12 6 - 20 mg/dL   Creatinine, Ser 1.610.88 0.44 - 1.00 mg/dL   Calcium 9.7 8.9 - 09.610.3 mg/dL   Total Protein  7.4 6.5 - 8.1 g/dL   Albumin 4.7 3.5 - 5.0 g/dL   AST 26 15 - 41 U/L   ALT 21 14 - 54 U/L   Alkaline Phosphatase 62 38 - 126 U/L   Total Bilirubin 1.4 (H) 0.3 - 1.2 mg/dL   GFR calc non Af Amer >60 >60 mL/min   GFR calc Af Amer >60 >60 mL/min   Anion gap 10 5 - 15  CBC     Status: None   Collection Time: 12/11/16  5:46 PM  Result Value Ref Range   WBC 6.1 3.6 - 11.0 K/uL   RBC 4.77 3.80 - 5.20 MIL/uL   Hemoglobin 14.7 12.0 - 16.0 g/dL   HCT 04.544.1 40.935.0 - 81.147.0 %   MCV 92.6 80.0 - 100.0 fL   MCH 30.9 26.0 - 34.0 pg   MCHC 33.3 32.0 - 36.0 g/dL   RDW 91.414.2 78.211.5 - 95.614.5 %   Platelets 220 150 - 440 K/uL  Urinalysis, Complete w Microscopic     Status: Abnormal   Collection Time: 12/11/16  7:23 PM  Result Value Ref Range   Color, Urine YELLOW (A) YELLOW   APPearance HAZY (A) CLEAR   Specific Gravity, Urine 1.016 1.005 - 1.030   pH 7.0 5.0 - 8.0   Glucose, UA NEGATIVE NEGATIVE mg/dL   Hgb urine dipstick SMALL (A) NEGATIVE   Bilirubin Urine NEGATIVE NEGATIVE   Ketones, ur NEGATIVE NEGATIVE mg/dL   Protein, ur NEGATIVE NEGATIVE mg/dL   Nitrite NEGATIVE NEGATIVE   Leukocytes, UA SMALL (A) NEGATIVE   RBC / HPF 0-5 0 - 5 RBC/hpf   WBC, UA TOO NUMEROUS TO COUNT 0 - 5 WBC/hpf   Bacteria, UA RARE (A) NONE SEEN   Squamous Epithelial / LPF 0-5 (A) NONE SEEN   Mucus PRESENT    Amorphous Crystal PRESENT   Lactic acid, plasma     Status: None   Collection Time: 12/11/16  8:40 PM  Result Value Ref Range   Lactic Acid, Venous 1.0 0.5 - 1.9 mmol/L    Imaging: Ct Abdomen Pelvis W Contrast  Result Date: 12/11/2016 CLINICAL DATA:  Abdominal pain EXAM: CT ABDOMEN AND PELVIS WITH CONTRAST TECHNIQUE: Multidetector CT imaging of the abdomen and pelvis was performed using the standard protocol following bolus administration of intravenous contrast. CONTRAST:  100mL ISOVUE-300 IOPAMIDOL (ISOVUE-300) INJECTION 61% COMPARISON:  12/05/2013 FINDINGS: Lower chest: Lung bases demonstrate patchy dependent  atelectasis. Normal heart size. Hepatobiliary: Interval cholecystectomy. Slight intra and extrahepatic biliary enlargement likely due to postsurgical change. Pancreas: Unremarkable. No pancreatic ductal dilatation or surrounding inflammatory changes. Spleen: Normal in size without focal abnormality. Adrenals/Urinary Tract: Adrenal glands are within normal limits. Stable small cyst mid pole right kidney. No hydronephrosis. The bladder is unremarkable Stomach/Bowel: The stomach is nonenlarged. There are a few fluid-filled but nonenlarged bowel loops in the right lower quadrant and upper pelvis. No bowel wall thickening. Nonvisualized appendix. Vascular/Lymphatic: Aortic atherosclerosis.  No enlarged abdominal or pelvic lymph nodes. Reproductive: Status post hysterectomy. No adnexal masses. Other: Negative for free air or free fluid. Small fat in the umbilicus. Musculoskeletal: Degenerative changes. No acute or suspicious bone lesion IMPRESSION: 1. Few fluid-filled but nonenlarged loops of small bowel in the right lower quadrant and upper pelvis, could consider a mild ileus. Developing or partial obstruction could be considered in the appropriate clinical setting. 2. Interval cholecystectomy with prominent extrahepatic bile duct likely due to postsurgical changes. Electronically Signed   By: Jasmine Pang M.D.   On: 12/11/2016 20:14    Assessment and Plan: This is a 76 y.o. female who presents with a one-day history of abdominal pain and findings concerning for partial small bowel obstruction.  I have independently viewed the patient's imaging study and reviewed her laboratory studies.  Overall, her CT scan shows some dilated loops of small bowel, with decompressed terminal ileum and decompressed colon distally.  Her labs are overall unremarkable.  Patient will be admitted to the surgical team.  Will be NPO with IV fluid hydration.  Discussed with the patient the role for conservative management for the treatment  of small bowel obstruction.  Will have appropriate pain and nausea medication and will change her home meds to IV form.  Patient is aware that she may need surgery if there is no improvement of her obstruction, but that it is more likely to improve than not.  Will have KUB in the morning to evaluate the flow / movement of contrast she took for her CT scan.  Patient understands this plan and all of her questions have been answered.   Howie Ill, MD [facility]

## 2016-12-11 NOTE — ED Triage Notes (Signed)
Pt comes into the ED via POV c/o abdominal pain that started at 14:30.  Patient denies any chest pain, shortness of breath, or dizziness.  Patient ambulatory to triage at this time but unable to sit still due to being uncomfortable.  Patient in NAD at this time with even and unlabored respirations.  Denies any N/V/D.

## 2016-12-11 NOTE — ED Notes (Signed)
Pt reports diffuse abdominal pain since 230 pm, states the pain is constant but feels a spasm intermittently.  Pt unable to sit still due to the pain and is guarding abdomen. Pt has hx of lap chole and is not sure if they took her appendix or not. Last BM this morning which she states was normal. Pt states she feels like she has gas and has tried drinking coke and taking tums without relief.

## 2016-12-12 ENCOUNTER — Inpatient Hospital Stay: Payer: Medicare Other

## 2016-12-12 DIAGNOSIS — K5652 Intestinal adhesions [bands] with complete obstruction: Secondary | ICD-10-CM

## 2016-12-12 LAB — BASIC METABOLIC PANEL
Anion gap: 3 — ABNORMAL LOW (ref 5–15)
BUN: 9 mg/dL (ref 6–20)
CHLORIDE: 105 mmol/L (ref 101–111)
CO2: 28 mmol/L (ref 22–32)
CREATININE: 0.78 mg/dL (ref 0.44–1.00)
Calcium: 8.3 mg/dL — ABNORMAL LOW (ref 8.9–10.3)
GFR calc Af Amer: >60 mL/min (ref >60)
GFR calc non Af Amer: >60 mL/min (ref >60)
GLUCOSE: 127 mg/dL — AB (ref 65–99)
Potassium: 4 mmol/L (ref 3.5–5.1)
SODIUM: 136 mmol/L (ref 135–145)

## 2016-12-12 LAB — CBC WITH DIFFERENTIAL/PLATELET
Basophils Absolute: 0 10*3/uL (ref 0–0.1)
Basophils Relative: 0 %
EOS PCT: 1 %
Eosinophils Absolute: 0 10*3/uL (ref 0–0.7)
HCT: 37.8 % (ref 35.0–47.0)
Hemoglobin: 13 g/dL (ref 12.0–16.0)
LYMPHS ABS: 1 10*3/uL (ref 1.0–3.6)
LYMPHS PCT: 17 %
MCH: 31.6 pg (ref 26.0–34.0)
MCHC: 34.4 g/dL (ref 32.0–36.0)
MCV: 92 fL (ref 80.0–100.0)
MONO ABS: 0.4 10*3/uL (ref 0.2–0.9)
MONOS PCT: 6 %
Neutro Abs: 4.4 10*3/uL (ref 1.4–6.5)
Neutrophils Relative %: 76 %
PLATELETS: 196 10*3/uL (ref 150–440)
RBC: 4.11 MIL/uL (ref 3.80–5.20)
RDW: 14 % (ref 11.5–14.5)
WBC: 5.8 10*3/uL (ref 3.6–11.0)

## 2016-12-12 LAB — MAGNESIUM: Magnesium: 2 mg/dL (ref 1.7–2.4)

## 2016-12-12 NOTE — Progress Notes (Signed)
SURGICAL PROGRESS NOTE (cpt [phone])  Hospital Day(s): 1.   Post op day(s):  Marland Kitchen   Interval History: Patient seen and examined, no acute events or new complaints since admission overnight. Patient reports her abdominal pain and nausea have both resolved with no emesis since a single episode of non-bloody emesis in the ED after she drank contrast for her CT, but she denies any flatus thus far and still describes feeling "bloated". She also reports a large BM yesterday morning and a second small BM yesterday. She otherwise denies any CP or SOB and plans to ambulate in the halls this morning.  Review of Systems:  Constitutional: denies fever, chills  HEENT: denies cough or congestion  Respiratory: denies any shortness of breath  Cardiovascular: denies chest pain or palpitations  Gastrointestinal: abdominal pain, N/V, and bowel function as per interval history Genitourinary: denies burning with urination or urinary frequency Musculoskeletal: denies pain, decreased motor or sensation Integumentary: denies any other rashes or skin discolorations Neurological: denies HA or vision/hearing changes   Vital signs in last 24 hours: [min-max] current  Temp:  [97.6 F (36.4 C)-97.7 F (36.5 C)] 97.6 F (36.4 C) (10/26 0457) Pulse Rate:  [62-95] 69 (10/26 0457) Resp:  [18-20] 20 (10/26 0457) BP: (127-197)/(49-129) 132/66 (10/26 0457) SpO2:  [96 %-99 %] 96 % (10/26 0457) Weight:  [160 lb (72.6 kg)] 160 lb (72.6 kg) (10/25 1744)     Height: 5\' 3"  (160 cm) Weight: 160 lb (72.6 kg) BMI (Calculated): 28.35   Intake/Output this shift:  No intake/output data recorded.   Intake/Output last 2 shifts:  @IOLAST2SHIFTS @   Physical Exam:  Constitutional: alert, cooperative and no distress  HENT: normocephalic without obvious abnormality  Eyes: PERRL, EOM's grossly intact and symmetric  Neuro: CN II - XII grossly intact and symmetric without deficit  Respiratory: breathing non-labored at rest   Cardiovascular: regular rate and sinus rhythm  Gastrointestinal: soft, non-tender, and mild-/moderately- distended Musculoskeletal: UE and LE FROM, no edema or wounds, motor and sensation grossly intact, NT   Labs:  CBC Latest Ref Rng & Units 12/12/2016 12/11/2016 12/06/2013  WBC 3.6 - 11.0 K/uL 5.8 6.1 5.9  Hemoglobin 12.0 - 16.0 g/dL 21.3 08.6 57.8  Hematocrit 35.0 - 47.0 % 37.8 44.1 38.7  Platelets 150 - 440 K/uL 196 220 208   CMP Latest Ref Rng & Units 12/12/2016 12/11/2016 12/06/2013  Glucose 65 - 99 mg/dL 469(G) 295(M) 841(L)  BUN 6 - 20 mg/dL 9 12 7   Creatinine 0.44 - 1.00 mg/dL 2.44 0.10 2.72  Sodium 135 - 145 mmol/L 136 139 142  Potassium 3.5 - 5.1 mmol/L 4.0 4.0 4.4  Chloride 101 - 111 mmol/L 105 100(L) 109(H)  CO2 22 - 32 mmol/L 28 29 27   Calcium 8.9 - 10.3 mg/dL 8.3(L) 9.7 8.0(L)  Total Protein 6.5 - 8.1 g/dL - 7.4 6.1(L)  Total Bilirubin 0.3 - 1.2 mg/dL - 5.3(G) 1.0  Alkaline Phos 38 - 126 U/L - 62 62  AST 15 - 41 U/L - 26 45(H)  ALT 14 - 54 U/L - 21 60   Imaging studies:  Abdominal X-ray (12/12/2016) - personally reviewed and compared to admission CT Abdomen and Pelvis (12/11/2016) Contrast material from the patient's CT scan has passed into the colon. No small bowel contrast or small bowel dilatation is identified.   Assessment/Plan: (ICD-10's: K14.52) 76 y.o. female with resolving relatively mild complete SBO attributable to post-surgical adhesions s/p abdominal hysterectomy and cholecystectomy, complicated by pertinent comorbidities including HTN, HLD,  hypothyroidism, GERD with history of gastritis and dysphasia, osteoarthritis, and degenerative disc disease.   - NPO, IVF for now   - anticipate clear liquids diet when flatus   - monitor abdominal exam and bowel function   - DVT prophylaxis, ambulation encouraged   - medical management of comorbidities  All of the above findings and recommendations were discussed with the patient, and all of patient's  questions were answered to her expressed satisfaction.  -- Scherrie GerlachJason E. Earlene Plateravis, MD, RPVI [place]: [facility] - Partnering for exceptional care. Office: [phone]

## 2016-12-13 NOTE — Progress Notes (Addendum)
ADDENDUM: Patient reassessed and reports now passing flatus more consistently following another larger formed BM. She says that she feels much better now and describes resolution of her "bloating" earlier today, continues to deny abdominal pain, N/V, fever/chills, CP, or SOB.  BP (!) 165/70 (BP Location: Right Arm)   Pulse 64   Temp 98.6 F (37 C) (Oral)   Resp 16   Ht 5\' 3"  (1.6 m)   Wt 160 lb (72.6 kg)   SpO2 93%   BMI 28.34 kg/m    General: sitting comfortably in chair, NAD GI: abdomen soft, NT, and non-distended  Due to inconsistency of patient's bowel function and how she's felt throughout today, she agrees to monitoring of her abdominal exam through tomorrow with discharge planning intended if her symptoms and PO tolerance continue to improve.  -- Scherrie Gerlach Earlene Plater, MD, RPVI [place]: [facility] General Surgery - Partnering for exceptional care. Office: [phone]       SURGICAL PROGRESS NOTE (cpt [phone])  Hospital Day(s): 2.   Post op day(s):  Marland Kitchen   Interval History: Patient seen and examined this morning with no acute events or new complaints overnight. Patient at that time reported single +flatus with BM and tolerating clear liquids diet with wanting to eat solid foods and denying N/V, abdominal pain, fever/chills, CP, or SOB. Plan at that time was advance to soft diet and discharge planning, but upon follow-up, patient reports she again feels "bloated" after eating grilled cheese and mashed potatoes. She continues to deny abdominal pain or N/V, but has not passed flatus since this morning.  Review of Systems:  Constitutional: denies fever, chills  HEENT: denies cough or congestion  Respiratory: denies any shortness of breath  Cardiovascular: denies chest pain or palpitations  Gastrointestinal: abdominal pain, N/V, and bowel function as per interval history Genitourinary: denies burning with urination or urinary frequency Musculoskeletal: denies  pain, decreased motor or sensation Integumentary: denies any other rashes or skin discolorations Neurological: denies HA or vision/hearing changes   Vital signs in last 24 hours: [min-max] current  Temp:  [98.2 F (36.8 C)-98.8 F (37.1 C)] 98.6 F (37 C) (10/27 0745) Pulse Rate:  [64-83] 64 (10/27 0745) Resp:  [16] 16 (10/27 0745) BP: (130-165)/(65-83) 165/70 (10/27 0745) SpO2:  [93 %-95 %] 93 % (10/27 0745)     Height: 5\' 3"  (160 cm) Weight: 160 lb (72.6 kg) BMI (Calculated): 28.35   Intake/Output this shift:  Total I/O In: 540 [P.O.:540] Out: 0    Intake/Output last 2 shifts:  @IOLAST2SHIFTS @   Physical Exam:  Constitutional: alert, cooperative and no distress  HENT: normocephalic without obvious abnormality  Eyes: PERRL, EOM's grossly intact and symmetric  Neuro: CN II - XII grossly intact and symmetric without deficit  Respiratory: breathing non-labored at rest  Cardiovascular: regular rate and sinus rhythm  Gastrointestinal: soft, non-tender, and overweight, but no change in abdominal distention (?mildly distended) Musculoskeletal: UE and LE FROM, no edema or wounds, motor and sensation grossly intact, NT   Labs:  CBC Latest Ref Rng & Units 12/12/2016 12/11/2016 12/06/2013  WBC 3.6 - 11.0 K/uL 5.8 6.1 5.9  Hemoglobin 12.0 - 16.0 g/dL 62.1 30.8 65.7  Hematocrit 35.0 - 47.0 % 37.8 44.1 38.7  Platelets 150 - 440 K/uL 196 220 208   CMP Latest Ref Rng & Units 12/12/2016 12/11/2016 12/06/2013  Glucose 65 - 99 mg/dL 846(N) 629(B) 284(X)  BUN 6 - 20 mg/dL 9 12 7   Creatinine 0.44 - 1.00 mg/dL  0.78 0.88 0.79  Sodium 135 - 145 mmol/L 136 139 142  Potassium 3.5 - 5.1 mmol/L 4.0 4.0 4.4  Chloride 101 - 111 mmol/L 105 100(L) 109(H)  CO2 22 - 32 mmol/L 28 29 27   Calcium 8.9 - 10.3 mg/dL 8.3(L) 9.7 8.0(L)  Total Protein 6.5 - 8.1 g/dL - 7.4 6.1(L)  Total Bilirubin 0.3 - 1.2 mg/dL - 1.6(X1.4(H) 1.0  Alkaline Phos 38 - 126 U/L - 62 62  AST 15 - 41 U/L - 26 45(H)  ALT 14 - 54 U/L -  21 60   Imaging studies: No new pertinent imaging studies   Assessment/Plan: (ICD-10's: 52K56.52) 76 y.o. female with resolving mild complete SBO attributable to post-surgical adhesions s/p abdominal hysterectomy and cholecystectomy with oral contrast passed into colon on abdominal x-ray <12 hours after CT, complicated by pertinent comorbidities including HTN, HLD, hypothyroidism, GERD with history of gastritis and dysphasia, osteoarthritis, and degenerative disc disease.              - ate soft diet for lunch, will reassess             - monitor abdominal exam and bowel function              - DVT prophylaxis, ambulation encouraged              - medical management of comorbidities  All of the above findings and recommendations were discussed with the patient and her family, and all of patient's and her family's questions were answered to their expressed satisfaction.  -- Scherrie GerlachJason E. Earlene Plateravis, MD, RPVI [place]: [facility] - Partnering for exceptional care. Office: [phone]

## 2016-12-14 MED ORDER — MAGNESIUM HYDROXIDE 400 MG/5ML PO SUSP
960.0000 mL | Freq: Once | ORAL | Status: AC
  Administered 2016-12-14: 960 mL via RECTAL
  Filled 2016-12-14: qty 473

## 2016-12-14 NOTE — Progress Notes (Signed)
SURGICAL PROGRESS NOTE (cpt [phone])  Hospital Day(s): 3.   Post op day(s):  Marland Kitchen   Interval History: Patient seen and examined, no acute events or new complaints overnight. Patient reports she continues to feel well without abdominal pain or N/V, denies fever/chills, CP, or SOB, but she also continues to deny flatus despite BM x 3 yesterday and says she again feels "bloated" after breakfast this morning. She has been walking extensively in the halls.  Review of Systems:  Constitutional: denies fever, chills  HEENT: denies cough or congestion  Respiratory: denies any shortness of breath  Cardiovascular: denies chest pain or palpitations  Gastrointestinal: abdominal pain, N/V, and bowel function as per interval history Genitourinary: denies burning with urination or urinary frequency Musculoskeletal: denies pain, decreased motor or sensation Integumentary: denies any other rashes or skin discolorations Neurological: denies HA or vision/hearing changes   Vital signs in last 24 hours: [min-max] current  Temp:  [98.1 F (36.7 C)] 98.1 F (36.7 C) (10/27 2044) Pulse Rate:  [89] 89 (10/27 2044) Resp:  [16] 16 (10/27 2044) BP: (159)/(89) 159/89 (10/27 2044) SpO2:  [96 %] 96 % (10/27 2044)     Height: 5\' 3"  (160 cm) Weight: 160 lb (72.6 kg) BMI (Calculated): 28.35   Intake/Output this shift:  No intake/output data recorded.   Intake/Output last 2 shifts:  @IOLAST2SHIFTS @   Physical Exam:  Constitutional: alert, cooperative and no distress  HENT: normocephalic without obvious abnormality  Eyes: PERRL, EOM's grossly intact and symmetric  Neuro: CN II - XII grossly intact and symmetric without deficit  Respiratory: breathing non-labored at rest  Cardiovascular: regular rate and sinus rhythm  Gastrointestinal: soft and non-tender, but upper abdomen mildly distended Musculoskeletal: UE and LE FROM, no edema or wounds, motor and sensation grossly intact, NT   Labs:  CBC Latest Ref Rng &  Units 12/12/2016 12/11/2016 12/06/2013  WBC 3.6 - 11.0 K/uL 5.8 6.1 5.9  Hemoglobin 12.0 - 16.0 g/dL 60.4 54.0 98.1  Hematocrit 35.0 - 47.0 % 37.8 44.1 38.7  Platelets 150 - 440 K/uL 196 220 208   CMP Latest Ref Rng & Units 12/12/2016 12/11/2016 12/06/2013  Glucose 65 - 99 mg/dL 191(Y) 782(N) 562(Z)  BUN 6 - 20 mg/dL 9 12 7   Creatinine 0.44 - 1.00 mg/dL 3.08 6.57 8.46  Sodium 135 - 145 mmol/L 136 139 142  Potassium 3.5 - 5.1 mmol/L 4.0 4.0 4.4  Chloride 101 - 111 mmol/L 105 100(L) 109(H)  CO2 22 - 32 mmol/L 28 29 27   Calcium 8.9 - 10.3 mg/dL 8.3(L) 9.7 8.0(L)  Total Protein 6.5 - 8.1 g/dL - 7.4 6.1(L)  Total Bilirubin 0.3 - 1.2 mg/dL - 9.6(E) 1.0  Alkaline Phos 38 - 126 U/L - 62 62  AST 15 - 41 U/L - 26 45(H)  ALT 14 - 54 U/L - 21 60   Imaging studies: No new pertinent imaging studies   Assessment/Plan:(ICD-10's: K56.52) 76 y.o.femalewith resolving partial SBO attributable to post-surgical adhesions s/p abdominal hysterectomy and cholecystectomy with oral contrast passed into colon on abdominal x-ray <12 hours after CT, complicated by pertinent comorbidities including HTN, HLD, hypothyroidism, GERD with history of gastritis and dysphasia, osteoarthritis, and degenerative disc disease.  - continue soft diet prn - monitor abdominal exam and bowel function   - will try an enema considering contrast passed into colon and patient not complaining of abdominal pain or N/V  - if no flatus by this afternoon, will check small bowel follow-through x-ray series  - explained  patient may still benefit from NG tube or surgery if fails to resolve - DVT prophylaxis, ambulation encouraged  - medical management of comorbidities  All of the above findings and recommendations were discussed with the patient and her family, and all of patient's and her family's questions were answered to their expressed satisfaction.  -- Scherrie GerlachJason E. Earlene Plateravis, MD,  RPVI [place]: [facility] - Partnering for exceptional care. Office: [phone]

## 2016-12-15 NOTE — Care Management Important Message (Signed)
Important Message  Patient Details  Name: Laurie [name] MRN: [id number] Date of Birth: 12-10-1940   Medicare Important Message Given:  Yes    Chapman FitchBOWEN, Young Brim T, RN 12/15/2016, 10:04 AM

## 2016-12-15 NOTE — Discharge Summary (Signed)
Patient ID: Laurie [name] MRN: [id number] DOB/AGE: 20-Jul-1940 76 y.o.  Admit date: 12/11/2016 Discharge date: 12/15/2016  Discharge Diagnoses:  Small bowel obstruction  Procedures Performed: None  Discharged Condition: good  Hospital Course: Admitted for SBO. Resolved without decompression but has recurrent bloating. Unable to have follow through prior to discharge.   Discharge Orders: Discharge Instructions    Call MD for:  persistant nausea and vomiting    Complete by:  As directed    Call MD for:  severe uncontrolled pain    Complete by:  As directed    Call MD for:  temperature >100.4    Complete by:  As directed    Diet - low sodium heart healthy    Complete by:  As directed    Increase activity slowly    Complete by:  As directed       Disposition:   Discharge Medications: Allergies as of 12/15/2016   No Known Allergies     Medication List    TAKE these medications   levothyroxine 75 MCG tablet Commonly known as:  SYNTHROID, LEVOTHROID Take 75 mcg by mouth daily before breakfast. 30 minutes before breakfast   losartan 50 MG tablet Commonly known as:  COZAAR TAKE 1 TABLET DAILY        Follwup: Follow-up Information    Ancil Linseyavis, Jason Evan, MD. Go in 3 day(s).   Specialty:  General Surgery Why:  f/u SBO Contact information: [address] [place] [id number] [phone]           Signed: Ricarda FrameCharles Jalayla Chrismer 12/15/2016, 9:13 AM

## 2016-12-16 ENCOUNTER — Other Ambulatory Visit: Payer: Self-pay

## 2016-12-16 ENCOUNTER — Telehealth: Payer: Self-pay

## 2016-12-16 DIAGNOSIS — E079 Disorder of thyroid, unspecified: Secondary | ICD-10-CM

## 2016-12-16 DIAGNOSIS — D72819 Decreased white blood cell count, unspecified: Secondary | ICD-10-CM

## 2016-12-16 DIAGNOSIS — R3129 Other microscopic hematuria: Secondary | ICD-10-CM | POA: Insufficient documentation

## 2016-12-16 DIAGNOSIS — M5136 Other intervertebral disc degeneration, lumbar region: Secondary | ICD-10-CM | POA: Insufficient documentation

## 2016-12-16 DIAGNOSIS — E7800 Pure hypercholesterolemia, unspecified: Secondary | ICD-10-CM

## 2016-12-16 DIAGNOSIS — K219 Gastro-esophageal reflux disease without esophagitis: Secondary | ICD-10-CM | POA: Insufficient documentation

## 2016-12-16 DIAGNOSIS — M1990 Unspecified osteoarthritis, unspecified site: Secondary | ICD-10-CM | POA: Insufficient documentation

## 2016-12-16 DIAGNOSIS — M51369 Other intervertebral disc degeneration, lumbar region without mention of lumbar back pain or lower extremity pain: Secondary | ICD-10-CM | POA: Insufficient documentation

## 2016-12-16 DIAGNOSIS — I10 Essential (primary) hypertension: Secondary | ICD-10-CM | POA: Insufficient documentation

## 2016-12-16 DIAGNOSIS — K648 Other hemorrhoids: Secondary | ICD-10-CM | POA: Insufficient documentation

## 2016-12-16 HISTORY — DX: Other hemorrhoids: K64.8

## 2016-12-16 HISTORY — DX: Gastro-esophageal reflux disease without esophagitis: K21.9

## 2016-12-16 HISTORY — DX: Decreased white blood cell count, unspecified: D72.819

## 2016-12-16 HISTORY — DX: Other microscopic hematuria: R31.29

## 2016-12-16 HISTORY — DX: Other intervertebral disc degeneration, lumbar region: M51.36

## 2016-12-16 HISTORY — DX: Unspecified osteoarthritis, unspecified site: M19.90

## 2016-12-16 HISTORY — DX: Pure hypercholesterolemia, unspecified: E78.00

## 2016-12-16 HISTORY — DX: Disorder of thyroid, unspecified: E07.9

## 2016-12-16 NOTE — Telephone Encounter (Signed)
Hospital Follow-up call made to patient at this time. Spoke with Laurie [name] . Hospital Follow-up interview questions below.  1. How are you feeling? Feeling good  2. Is your pain controlled? yes  3. What are you doing for the pain? N/A  4. Are you having any Nausea or Vomiting? None  5. Are you having any Fever or Chills? None  6. Are you having any Constipation or Diarrhea? None  7. Do you have any questions or concerns at this time? None   Discussion: Stated that she has a good appetite. Reminded of follow up appointment on 11/1 at 1:30. Patient verbalized understanding.

## 2016-12-18 ENCOUNTER — Ambulatory Visit (INDEPENDENT_AMBULATORY_CARE_PROVIDER_SITE_OTHER): Payer: Medicare Other | Admitting: Surgery

## 2016-12-18 ENCOUNTER — Encounter: Payer: Self-pay | Admitting: Surgery

## 2016-12-18 VITALS — BP 126/82 | HR 69 | Temp 98.2°F | Ht 63.0 in | Wt 171.0 lb

## 2016-12-18 DIAGNOSIS — K56600 Partial intestinal obstruction, unspecified as to cause: Secondary | ICD-10-CM

## 2016-12-18 NOTE — Patient Instructions (Signed)
Please call our office if you feel that you need to be seen or have any questions or concerns.   Small Bowel Obstruction A small bowel obstruction means that something is blocking the small bowel. The small bowel is also called the small intestine. It is the long tube that connects the stomach to the colon. An obstruction will stop food and fluids from passing through the small bowel. Treatment depends on what is causing the problem and how bad the problem is. Follow these instructions at home:  Get a lot of rest.  Follow your diet as told by your doctor. You may need to: ? Only drink clear liquids until you start to get better. ? Avoid solid foods as told by your doctor.  Take over-the-counter and prescription medicines only as told by your doctor.  Keep all follow-up visits as told by your doctor. This is important. Contact a doctor if:  You have a fever.  You have chills. Get help right away if:  You have pain or cramps that get worse.  You throw up (vomit) blood.  You have a feeling of being sick to your stomach (nausea) that does not go away.  You cannot stop throwing up.  You cannot drink fluids.  You feel confused.  You feel dry or thirsty (dehydrated).  Your belly gets more bloated.  You feel weak or you pass out (faint). This information is not intended to replace advice given to you by your health care provider. Make sure you discuss any questions you have with your health care provider. Document Released: 03/13/2004 Document Revised: 10/01/2015 Document Reviewed: 03/30/2014 Elsevier Interactive Patient Education  [employer]2018 Elsevier Inc.

## 2016-12-18 NOTE — Progress Notes (Signed)
Surgical Clinic Progress/Follow-up Note   HPI:  76 y.o. Female presents to clinic for follow-up evaluation following recent small bowel obstruction vs partial small bowel obstruction, for which patient was recently hospitalized, during which she experienced resolution of her abdominal pain and N/V, but continued to experience bloating after eating and absence of flatus despite BM's. Patient today reports complete resolution of all associated symptoms. She describes she has been tolerating PO with normal appetite and denies abdominal pain or N/V with both flatus and normal daily BM without constipation or diarrhea, no fever/chills, CP or SOB.  Review of Systems:  Constitutional: denies any other weight loss, fever, chills, or sweats  Eyes: denies any other vision changes, history of eye injury  ENT: denies sore throat, hearing problems  Respiratory: denies shortness of breath, wheezing  Cardiovascular: denies chest pain, palpitations  Gastrointestinal: abdominal pain, N/V, and bowel function as per HPI Musculoskeletal: denies any other joint pains or cramps  Skin: Denies any other rashes or skin discolorations  Neurological: denies any other headache, dizziness, weakness  Psychiatric: denies any other depression, anxiety  All other review of systems: otherwise negative   Vital Signs:  BP 126/82   Pulse 69   Temp 98.2 F (36.8 C) (Oral)   Ht 5\' 3"  (1.6 m)   Wt 171 lb (77.6 kg)   BMI 30.29 kg/m     Physical Exam:  Constitutional:  -- Normal overweight body habitus -- Appears vibrant and much younger than her chronological age -- Awake, alert, and oriented x3  Eyes:  -- Pupils equally round and reactive to light  -- No scleral icterus  Ear, nose, throat:  -- No jugular venous distension  -- No nasal drainage, bleeding Pulmonary:  -- No crackles -- Equal breath sounds bilaterally -- Breathing non-labored at rest Cardiovascular:  -- S1, S2 present  -- No pericardial rubs   Gastrointestinal:  -- Soft, nontender, non-distended, no guarding/rebound  -- No abdominal masses appreciated, pulsatile or otherwise  Musculoskeletal / Integumentary:  -- Wounds or skin discoloration: None appreciated  -- Extremities: B/L UE and LE FROM, hands and feet warm, no edema  Neurologic:  -- Motor function: intact and symmetric  -- Sensation: intact and symmetric   Assessment:  76 y.o. yo Female with a problem list including...  Patient Active Problem List   Diagnosis Date Noted  . DDD (degenerative disc disease), lumbar 12/16/2016  . DJD (degenerative joint disease) 12/16/2016  . GERD (gastroesophageal reflux disease) 12/16/2016  . High cholesterol 12/16/2016  . Hypertension 12/16/2016  . Internal hemorrhoids 12/16/2016  . Leukopenia 12/16/2016  . Microscopic hematuria 12/16/2016  . Thyroid disorder 12/16/2016  . Partial small bowel obstruction (HCC) 12/11/2016  . Facet arthritis of lumbar region (HCC) 09/24/2015  . Vaginal lesion 06/24/2013  . Urethral discharge 05/18/2013  . 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 Urge incontinence 05/18/2013    presents to clinic for follow-up evaluation, doing well s/p complete resolution of small bowel obstruction attributed to post-surgical intraperitoneal adhesions.  Plan:   - maintain hydration and high fiber heart healthy diet   - return to clinic as needed, instructed to call office if any questions or concerns  - if symptoms recur, consider small bowel follow-through series and GI evaluation  All of the above recommendations were discussed with the patient and her husband, and all of patient's and family's questions were answered to their expressed satisfaction.  -- Scherrie GerlachJason E. Earlene Plateravis, MD, RPVI [place]: [facility] - Partnering  for exceptional care. Office: [phone]

## 2016-12-22 DIAGNOSIS — K5652 Intestinal adhesions [bands] with complete obstruction: Secondary | ICD-10-CM

## 2017-01-11 ENCOUNTER — Ambulatory Visit: Payer: Medicare Other

## 2017-01-11 ENCOUNTER — Encounter: Payer: Self-pay | Admitting: Emergency Medicine

## 2017-01-11 ENCOUNTER — Ambulatory Visit
Admission: EM | Admit: 2017-01-11 | Discharge: 2017-01-11 | Disposition: A | Discharge status: Home / Self Care | Payer: Medicare Other | Attending: Emergency Medicine | Admitting: Emergency Medicine

## 2017-01-11 DIAGNOSIS — J209 Acute bronchitis, unspecified: Secondary | ICD-10-CM | POA: Insufficient documentation

## 2017-01-11 DIAGNOSIS — R05 Cough: Secondary | ICD-10-CM

## 2017-01-11 DIAGNOSIS — J320 Chronic maxillary sinusitis: Secondary | ICD-10-CM | POA: Insufficient documentation

## 2017-01-11 DIAGNOSIS — E039 Hypothyroidism, unspecified: Secondary | ICD-10-CM | POA: Diagnosis not present

## 2017-01-11 DIAGNOSIS — I10 Essential (primary) hypertension: Secondary | ICD-10-CM | POA: Diagnosis not present

## 2017-01-11 DIAGNOSIS — K219 Gastro-esophageal reflux disease without esophagitis: Secondary | ICD-10-CM | POA: Diagnosis not present

## 2017-01-11 DIAGNOSIS — E7800 Pure hypercholesterolemia, unspecified: Secondary | ICD-10-CM | POA: Insufficient documentation

## 2017-01-11 DIAGNOSIS — Z79899 Other long term (current) drug therapy: Secondary | ICD-10-CM | POA: Insufficient documentation

## 2017-01-11 DIAGNOSIS — M5136 Other intervertebral disc degeneration, lumbar region: Secondary | ICD-10-CM | POA: Diagnosis not present

## 2017-01-11 DIAGNOSIS — Z90710 Acquired absence of both cervix and uterus: Secondary | ICD-10-CM | POA: Diagnosis not present

## 2017-01-11 MED ORDER — AMOXICILLIN-POT CLAVULANATE 875-125 MG PO TABS: 1.0000 | ORAL_TABLET | Freq: Two times a day (BID) | ORAL | 0 refills | Status: DC

## 2017-01-11 MED ORDER — METHYLPREDNISOLONE SODIUM SUCC 125 MG IJ SOLR
125.0000 mg | Freq: Once | INTRAMUSCULAR | Status: AC
  Administered 2017-01-11: 125 mg via INTRAMUSCULAR

## 2017-01-11 MED ORDER — HYDROCOD POLST-CPM POLST ER 10-8 MG/5ML PO SUER: 5.0000 mL | Freq: Every evening | ORAL | 0 refills | Status: DC | PRN

## 2017-01-11 MED ORDER — IPRATROPIUM-ALBUTEROL 0.5-2.5 (3) MG/3ML IN SOLN
3.0000 mL | Freq: Four times a day (QID) | RESPIRATORY_TRACT | Status: DC
Start: 2017-01-11 — End: 2017-01-11
  Administered 2017-01-11: 3 mL via RESPIRATORY_TRACT

## 2017-01-11 NOTE — ED Triage Notes (Signed)
Coughing spasm , congested, wheezing episode  All morning. [facility]. Noticed she coughed a little blood  [facility] Now has 3 swollen lymph nodes.

## 2017-01-11 NOTE — ED Provider Notes (Signed)
[facility]    CSN: [id number] Arrival date & time: 01/11/17  1203     History   Chief Complaint Chief Complaint  Patient presents with  . Cough    HPI Laurie [name] is a 76 y.o. female presents for evaluation of sinus pain, pressure as well as a terrible cough.  Patient states she developed cough and cold symptoms on December 17, 2016.  5-6 days later she was seen by primary care provider and placed on a Z-Pak.  She saw a resolution of her symptoms but not 100%.  Patient has had persistent sinus pain, dry nagging cough that will become severe at times.  She was seen by urgent care facility 2 days ago placed on moxifloxacin, prednisone, albuterol, and given Tessalon Perles.  No x-ray was obtained.  Patient feels that she is developed a rash to the moxifloxacin.  She denies any difficulty swallowing, facial swelling.  Rash is along her upper chest.  HPI  Past Medical History:  Diagnosis Date  . DDD (degenerative disc disease), lumbar 12/16/2016   Overview:  status post back surgery x 3.  . DJD (degenerative joint disease) 12/16/2016  . Facet arthritis of lumbar region (HCC) 09/24/2015  . GERD (gastroesophageal reflux disease) 12/16/2016  . High cholesterol 12/16/2016  . Hypertension   . Hypothyroid   . Internal hemorrhoids 12/16/2016  . Leukopenia 12/16/2016  . Microscopic hematuria 12/16/2016   Overview:  IVP, ultrasound unrevealing 2008. Status post Urology evaluation through Dr. Lonna Cobb.  . Partial small bowel obstruction (HCC) 12/11/2016  . Thyroid disorder 12/16/2016   Overview:  hypothyroidism  . Urethral discharge 05/18/2013  . 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 Urge incontinence 05/18/2013  . Vaginal lesion 06/24/2013    Patient Active Problem List   Diagnosis Date Noted  . Intestinal adhesions with complete obstruction (HCC)   . DDD (degenerative disc disease), lumbar 12/16/2016  . DJD (degenerative joint disease) 12/16/2016  . GERD (gastroesophageal  reflux disease) 12/16/2016  . High cholesterol 12/16/2016  . Hypertension 12/16/2016  . Internal hemorrhoids 12/16/2016  . Leukopenia 12/16/2016  . Microscopic hematuria 12/16/2016  . Thyroid disorder 12/16/2016  . Partial small bowel obstruction (HCC) 12/11/2016  . Facet arthritis of lumbar region (HCC) 09/24/2015  . Vaginal lesion 06/24/2013  . Urethral discharge 05/18/2013  . 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 Urge incontinence 05/18/2013    Past Surgical History:  Procedure Laterality Date  . ABDOMINAL HYSTERECTOMY    . BACK SURGERY    . CHOLECYSTECTOMY      OB History    No data available       Home Medications    Prior to Admission medications   Medication Sig Start Date End Date Taking? Authorizing Provider  albuterol (PROVENTIL HFA;VENTOLIN HFA) 108 (90 Base) MCG/ACT inhaler Inhale into the lungs every 6 (six) hours as needed for wheezing or shortness of breath.   Yes [provider]  benzonatate (TESSALON) 100 MG capsule Take by mouth 3 (three) times daily as needed for cough.   Yes [provider]  Coenzyme Q10 100 MG capsule Take by mouth.   Yes [provider]  levothyroxine (SYNTHROID, LEVOTHROID) 75 MCG tablet Take 75 mcg by mouth daily before breakfast. 30 minutes before breakfast 03/31/16  Yes [provider]  losartan (COZAAR) 50 MG tablet TAKE 1 TABLET DAILY 02/21/16  Yes [provider]  omeprazole (PRILOSEC) 20 MG capsule Take by mouth. 09/24/15  Yes [provider]  PNEUMOVAX 23 25 MCG/0.5ML  injection TO BE ADMINISTERED BY PHARMACIST FOR IMMUNIZATION 09/08/16  Yes [provider]  predniSONE (DELTASONE) 20 MG tablet Take 20 mg by mouth 2 (two) times daily with a meal.   Yes [provider]  rosuvastatin (CRESTOR) 10 MG tablet Take by mouth. 05/02/16 05/02/17 Yes [provider]  amoxicillin-clavulanate (AUGMENTIN) 875-125 MG tablet Take 1 tablet by mouth every 12 (twelve) hours. 10  days 01/11/17   Evon SlackGaines, Jane Broughton C, PA-C  chlorpheniramine-HYDROcodone [facility](TUSSIONEX PENNKINETIC ER) 10-8 MG/5ML SUER Take 5 mLs by mouth at bedtime as needed for cough. 01/11/17   Evon SlackGaines, Lovell Nuttall C, PA-C    Family History Family History  Problem Relation Age of Onset  . Hyperlipidemia Mother     Social History Social History   Tobacco Use  . Smoking status: Never Smoker  . Smokeless tobacco: Never Used  Substance Use Topics  . Alcohol use: Yes  . Drug use: No     Allergies   Amlodipine   Review of Systems Review of Systems  Constitutional: Negative for activity change, chills, fatigue and fever.  HENT: Positive for sinus pressure and sinus pain. Negative for congestion and sore throat.   Eyes: Negative for visual disturbance.  Respiratory: Positive for cough and wheezing. Negative for chest tightness and shortness of breath.   Cardiovascular: Negative for chest pain and leg swelling.  Gastrointestinal: Negative for abdominal pain, diarrhea, nausea and vomiting.  Genitourinary: Negative for dysuria.  Musculoskeletal: Negative for arthralgias and gait problem.  Skin: Negative for rash.  Neurological: Negative for weakness, numbness and headaches.  Hematological: Negative for adenopathy.  Psychiatric/Behavioral: Negative for agitation, behavioral problems and confusion.     Physical Exam Triage Vital Signs ED Triage Vitals  Enc Vitals Group     BP 01/11/17 1241 (!) 162/95     Pulse Rate 01/11/17 1241 88     Resp 01/11/17 1241 18     Temp 01/11/17 1241 98.3 F (36.8 C)     Temp Source 01/11/17 1241 Oral     SpO2 01/11/17 1241 97 %     Weight 01/11/17 1242 170 lb (77.1 kg)     Height 01/11/17 1242 5\' 3"  (1.6 m)     Head Circumference --      Peak Flow --      Pain Score --      Pain Loc --      Pain Edu? --      Excl. in GC? --    No data found.  Updated Vital Signs BP (!) 162/95 (BP Location: Left Arm)   Pulse 88   Temp 98.3 F (36.8 C) (Oral)   Resp 18    Ht 5\' 3"  (1.6 m)   Wt 170 lb (77.1 kg)   SpO2 97%   BMI 30.11 kg/m   Visual Acuity Right Eye Distance:   Left Eye Distance:   Bilateral Distance:    Right Eye Near:   Left Eye Near:    Bilateral Near:     Physical Exam  Constitutional: She is oriented to person, place, and time. She appears well-developed and well-nourished. No distress.  HENT:  Head: Normocephalic and atraumatic.  Right Ear: External ear normal.  Left Ear: External ear normal.  Nose: Nose normal.  Mouth/Throat: Uvula is midline and oropharynx is clear and moist. No oral lesions. No trismus in the jaw. Normal dentition. Dental abscesses and dental caries present. No uvula swelling. No oropharyngeal exudate.  Positive maxillary sinus tenderness bilaterally.  No pharyngeal  erythema exudates or swelling.  Eyes: Conjunctivae and EOM are normal.  Neck: Normal range of motion. Neck supple.  Cardiovascular: Normal rate and regular rhythm.  Pulmonary/Chest: Effort normal. No respiratory distress. She has wheezes (slight expiratory wheezing). She has no rales. She exhibits no tenderness.  Lymphadenopathy:    She has no cervical adenopathy.  Neurological: She is alert and oriented to person, place, and time.  Skin: Skin is warm and dry.  Psychiatric: She has a normal mood and affect. Her behavior is normal. Thought content normal.     UC Treatments / Results  Labs (all labs ordered are listed, but only abnormal results are displayed) Labs Reviewed - No data to display  EKG  EKG Interpretation None       Radiology Dg Chest 2 View  Result Date: 01/11/2017 CLINICAL DATA:  Productive cough.  Shortness of breath. EXAM: CHEST  2 VIEW COMPARISON:  Radiograph of December 04, 2013. FINDINGS: The heart size and mediastinal contours are within normal limits. Atherosclerosis of thoracic aorta is noted. No pneumothorax or pleural effusion is noted. Stable calcified granuloma seen in right upper lobe. No acute pulmonary  disease is noted. The visualized skeletal structures are unremarkable. IMPRESSION: No active cardiopulmonary disease.  Aortic atherosclerosis. Electronically Signed   By: Lupita RaiderJames  Green Jr, M.D.   On: 01/11/2017 14:06    Procedures Procedures (including critical care time)  Medications Ordered in UC Medications  ipratropium-albuterol (DUONEB) 0.5-2.5 (3) MG/3ML nebulizer solution 3 mL (3 mLs Nebulization Given 01/11/17 1333)  methylPREDNISolone sodium succinate (SOLU-MEDROL) 125 mg/2 mL injection 125 mg (125 mg Intramuscular Given 01/11/17 1332)     Initial Impression / Assessment and Plan / UC Course  I have reviewed the triage vital signs and the nursing notes.  Pertinent labs & imaging results that were available during my care of the patient were reviewed by me and considered in my medical decision making (see chart for details).    76 year old female with with bronchitis and maxillary sinusitis.  She is given DuoNeb and Solu-Medrol here in the clinic.  Breathing improved with no expiratory wheezing.  She will discontinue moxifloxacin and start Augmentin for sinusitis.  Chest x-ray showed no evidence of pneumonia.  Final Clinical Impressions(s) / UC Diagnoses   Final diagnoses:  Acute bronchitis, unspecified organism  Chronic maxillary sinusitis    ED Discharge Orders        Ordered    amoxicillin-clavulanate (AUGMENTIN) 875-125 MG tablet  Every 12 hours     01/11/17 1430    chlorpheniramine-HYDROcodone (TUSSIONEX PENNKINETIC ER) 10-8 MG/5ML SUER  At bedtime PRN     01/11/17 1430       Controlled Substance Prescriptions [place] Controlled Substance Registry consulted? Yes, I have consulted the  Controlled Substances Registry for this patient, and feel the risk/benefit ratio today is favorable for proceeding with this prescription for a controlled substance.   Evon SlackGaines, Leane Loring C, PA-C 01/11/17 1435

## 2017-01-11 NOTE — Discharge Instructions (Addendum)
Please take medications as prescribed.  Continue with oral prednisone.  Follow-up primary care provider if no improvement in 1 week.

## 2017-07-01 ENCOUNTER — Ambulatory Visit: Payer: Medicare Other

## 2017-07-01 ENCOUNTER — Encounter: Payer: Self-pay | Admitting: Emergency Medicine

## 2017-07-01 ENCOUNTER — Ambulatory Visit
Admission: EM | Admit: 2017-07-01 | Discharge: 2017-07-01 | Disposition: A | Discharge status: Home / Self Care | Payer: Medicare Other | Attending: Family Medicine | Admitting: Family Medicine

## 2017-07-01 ENCOUNTER — Other Ambulatory Visit: Payer: Self-pay

## 2017-07-01 DIAGNOSIS — Z9049 Acquired absence of other specified parts of digestive tract: Secondary | ICD-10-CM | POA: Insufficient documentation

## 2017-07-01 DIAGNOSIS — M5136 Other intervertebral disc degeneration, lumbar region: Secondary | ICD-10-CM | POA: Diagnosis not present

## 2017-07-01 DIAGNOSIS — Z79890 Hormone replacement therapy (postmenopausal): Secondary | ICD-10-CM | POA: Insufficient documentation

## 2017-07-01 DIAGNOSIS — Z79899 Other long term (current) drug therapy: Secondary | ICD-10-CM | POA: Insufficient documentation

## 2017-07-01 DIAGNOSIS — M25571 Pain in right ankle and joints of right foot: Secondary | ICD-10-CM | POA: Diagnosis present

## 2017-07-01 DIAGNOSIS — M25572 Pain in left ankle and joints of left foot: Secondary | ICD-10-CM

## 2017-07-01 DIAGNOSIS — E039 Hypothyroidism, unspecified: Secondary | ICD-10-CM | POA: Insufficient documentation

## 2017-07-01 DIAGNOSIS — Z888 Allergy status to other drugs, medicaments and biological substances status: Secondary | ICD-10-CM | POA: Insufficient documentation

## 2017-07-01 DIAGNOSIS — X58XXXA Exposure to other specified factors, initial encounter: Secondary | ICD-10-CM | POA: Diagnosis not present

## 2017-07-01 DIAGNOSIS — E7800 Pure hypercholesterolemia, unspecified: Secondary | ICD-10-CM | POA: Diagnosis not present

## 2017-07-01 DIAGNOSIS — K219 Gastro-esophageal reflux disease without esophagitis: Secondary | ICD-10-CM | POA: Diagnosis not present

## 2017-07-01 DIAGNOSIS — S86912A Strain of unspecified muscle(s) and tendon(s) at lower leg level, left leg, initial encounter: Secondary | ICD-10-CM | POA: Insufficient documentation

## 2017-07-01 DIAGNOSIS — K5652 Intestinal adhesions [bands] with complete obstruction: Secondary | ICD-10-CM | POA: Diagnosis not present

## 2017-07-01 DIAGNOSIS — I10 Essential (primary) hypertension: Secondary | ICD-10-CM | POA: Diagnosis not present

## 2017-07-01 DIAGNOSIS — Z7952 Long term (current) use of systemic steroids: Secondary | ICD-10-CM | POA: Diagnosis not present

## 2017-07-01 DIAGNOSIS — S86911A Strain of unspecified muscle(s) and tendon(s) at lower leg level, right leg, initial encounter: Secondary | ICD-10-CM

## 2017-07-01 MED ORDER — MELOXICAM 15 MG PO TABS: 15.0000 mg | ORAL_TABLET | Freq: Every day | ORAL | 0 refills | Status: DC

## 2017-07-01 NOTE — ED Provider Notes (Signed)
[facility]    CSN: [id number] Arrival date & time: 07/01/17  1008     History   Chief Complaint Chief Complaint  Patient presents with  . Ankle Pain    right    HPI Laurie [name] is a 77 y.o. female.   The history is provided by the patient.  Ankle Pain  Location:  Ankle and leg Injury: no   Leg location:  L lower leg Ankle location:  L ankle Chronicity:  New Dislocation: no   Prior injury to area:  No Relieved by:  None tried Ineffective treatments:  None tried Associated symptoms: no back pain, no decreased ROM, no fatigue, no fever, no itching, no muscle weakness, no neck pain, no numbness, no stiffness, no swelling and no tingling   Risk factors: no frequent fractures, no known bone disorder, no obesity and no recent illness     Past Medical History:  Diagnosis Date  . DDD (degenerative disc disease), lumbar 12/16/2016   Overview:  status post back surgery x 3.  . DJD (degenerative joint disease) 12/16/2016  . Facet arthritis of lumbar region 09/24/2015  . GERD (gastroesophageal reflux disease) 12/16/2016  . High cholesterol 12/16/2016  . Hypertension   . Hypothyroid   . Internal hemorrhoids 12/16/2016  . Leukopenia 12/16/2016  . Microscopic hematuria 12/16/2016   Overview:  IVP, ultrasound unrevealing 2008. Status post Urology evaluation through Dr. Lonna Cobb.  . Partial small bowel obstruction (HCC) 12/11/2016  . Thyroid disorder 12/16/2016   Overview:  hypothyroidism  . Urethral discharge 05/18/2013  . 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 Urge incontinence 05/18/2013  . Vaginal lesion 06/24/2013    Patient Active Problem List   Diagnosis Date Noted  . Intestinal adhesions with complete obstruction (HCC)   . DDD (degenerative disc disease), lumbar 12/16/2016  . DJD (degenerative joint disease) 12/16/2016  . GERD (gastroesophageal reflux disease) 12/16/2016  . High cholesterol 12/16/2016  . Hypertension 12/16/2016  . Internal hemorrhoids  12/16/2016  . Leukopenia 12/16/2016  . Microscopic hematuria 12/16/2016  . Thyroid disorder 12/16/2016  . Partial small bowel obstruction (HCC) 12/11/2016  . Facet arthritis of lumbar region 09/24/2015  . Vaginal lesion 06/24/2013  . Urethral discharge 05/18/2013  . 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 Urge incontinence 05/18/2013    Past Surgical History:  Procedure Laterality Date  . ABDOMINAL HYSTERECTOMY    . BACK SURGERY    . CHOLECYSTECTOMY      OB History   None      Home Medications    Prior to Admission medications   Medication Sig Start Date End Date Taking? Authorizing Provider  Coenzyme Q10 100 MG capsule Take by mouth.   Yes [provider]  levothyroxine (SYNTHROID, LEVOTHROID) 75 MCG tablet Take 75 mcg by mouth daily before breakfast. 30 minutes before breakfast 03/31/16  Yes [provider]  losartan (COZAAR) 50 MG tablet TAKE 1 TABLET DAILY 02/21/16  Yes [provider]  albuterol (PROVENTIL HFA;VENTOLIN HFA) 108 (90 Base) MCG/ACT inhaler Inhale into the lungs every 6 (six) hours as needed for wheezing or shortness of breath.    [provider]  amoxicillin-clavulanate (AUGMENTIN) 875-125 MG tablet Take 1 tablet by mouth every 12 (twelve) hours. 10 days 01/11/17   Evon Slack, PA-C  benzonatate (TESSALON) 100 MG capsule Take by mouth 3 (three) times daily as needed for cough.    [provider]  chlorpheniramine-HYDROcodone (TUSSIONEX PENNKINETIC ER) 10-8 MG/5ML SUER Take 5 mLs by mouth at bedtime  as needed for cough. 01/11/17   Evon Slack, PA-C  meloxicam (MOBIC) 15 MG tablet Take 1 tablet (15 mg total) by mouth daily. 07/01/17   Payton Mccallum, MD  omeprazole (PRILOSEC) 20 MG capsule Take by mouth. 09/24/15   [provider]  PNEUMOVAX 23 25 MCG/0.5ML injection TO BE ADMINISTERED BY PHARMACIST FOR IMMUNIZATION 09/08/16   [provider]  predniSONE (DELTASONE) 20 MG tablet Take 20 mg by mouth  2 (two) times daily with a meal.    [provider]  rosuvastatin (CRESTOR) 10 MG tablet Take by mouth. 05/02/16 05/02/17  [provider]    Family History Family History  Problem Relation Age of Onset  . Hyperlipidemia Mother   . Rectal cancer Mother   . Other Father        killed in World War II    Social History Social History   Tobacco Use  . Smoking status: Never Smoker  . Smokeless tobacco: Never Used  Substance Use Topics  . Alcohol use: Yes    Alcohol/week: 1.8 oz    Types: 3 Glasses of wine per week  . Drug use: No     Allergies   Amlodipine   Review of Systems Review of Systems  Constitutional: Negative for fatigue and fever.  Musculoskeletal: Negative for back pain, neck pain and stiffness.  Skin: Negative for itching.     Physical Exam Triage Vital Signs ED Triage Vitals  Enc Vitals Group     BP 07/01/17 1024 (!) 160/95     Pulse Rate 07/01/17 1024 83     Resp 07/01/17 1024 16     Temp 07/01/17 1024 97.7 F (36.5 C)     Temp Source 07/01/17 1024 Oral     SpO2 07/01/17 1024 97 %     Weight 07/01/17 1018 165 lb (74.8 kg)     Height 07/01/17 1018 5' 3.5" (1.613 m)     Head Circumference --      Peak Flow --      Pain Score 07/01/17 1017 5     Pain Loc --      Pain Edu? --      Excl. in [place]? --    No data found.  Updated Vital Signs BP (!) 160/95 (BP Location: Right Arm)   Pulse 83   Temp 97.7 F (36.5 C) (Oral)   Resp 16   Ht 5' 3.5" (1.613 m)   Wt 165 lb (74.8 kg)   SpO2 97%   BMI 28.77 kg/m   Visual Acuity Right Eye Distance:   Left Eye Distance:   Bilateral Distance:    Right Eye Near:   Left Eye Near:    Bilateral Near:     Physical Exam  Constitutional: She appears well-developed and well-nourished. No distress.  Cardiovascular: Intact distal pulses.  Musculoskeletal:       Right ankle: She exhibits normal range of motion, no swelling, no ecchymosis, no deformity, no laceration and normal pulse.  Tenderness (diffuse). Achilles tendon normal.       Right lower leg: She exhibits tenderness (posterior, soleus and calf area). She exhibits no bony tenderness, no swelling, no edema, no deformity and no laceration.  Skin: No rash noted. She is not diaphoretic.  Nursing note and vitals reviewed.    UC Treatments / Results  Labs (all labs ordered are listed, but only abnormal results are displayed) Labs Reviewed - No data to display  EKG None  Radiology Dg Ankle  Complete Right  Result Date: 07/01/2017 CLINICAL DATA:  Ankle pain for 2 months, initial encounter EXAM: RIGHT ANKLE - COMPLETE 3+ VIEW COMPARISON:  None. FINDINGS: There is no evidence of fracture, dislocation, or joint effusion. There is no evidence of arthropathy or other focal bone abnormality. Soft tissues are unremarkable. IMPRESSION: No acute abnormality noted. Electronically Signed   By: Alcide Clever M.D.   On: 07/01/2017 10:40    Procedures Procedures (including critical care time)  Medications Ordered in UC Medications - No data to display  Initial Impression / Assessment and Plan / UC Course  I have reviewed the triage vital signs and the nursing notes.  Pertinent labs & imaging results that were available during my care of the patient were reviewed by me and considered in my medical decision making (see chart for details).      Final Clinical Impressions(s) / UC Diagnoses   Final diagnoses:  Left ankle pain, unspecified chronicity  Muscle strain of left lower leg, initial encounter     Discharge Instructions     Heat, stretch, rest, ice Over the counter tylenol as needed Over the counter rub (Ben-Gay, Icy-Hot) Ankle sleeve/brace    ED Prescriptions    Medication Sig Dispense Auth. Provider   meloxicam (MOBIC) 15 MG tablet Take 1 tablet (15 mg total) by mouth daily. 30 tablet Payton Mccallum, MD      1. x-ray results and diagnosis reviewed with patient 2. rx as per orders above; reviewed  possible side effects, interactions, risks and benefits  3. Recommend supportive treatment with rest, stretch, ice/heat, otc tylenol  4. Follow-up prn if symptoms worsen or don't improve  Controlled Substance Prescriptions [place] Controlled Substance Registry consulted? Not Applicable   Payton Mccallum, MD 07/01/17 1233

## 2017-07-01 NOTE — Discharge Instructions (Signed)
Heat, stretch, rest, ice Over the counter tylenol as needed Over the counter rub (Ben-Gay, Icy-Hot) Ankle sleeve/brace

## 2017-07-01 NOTE — ED Triage Notes (Signed)
Patient in today c/o right ankle pain. Patient was using inverter and thinks she had it too tight. Patient states the ankle has been hurting off & on x 2 months, but worsening.

## 2018-09-13 ENCOUNTER — Ambulatory Visit
Admission: EM | Admit: 2018-09-13 | Discharge: 2018-09-13 | Discharge status: Absent without leave | Payer: Self-pay | Attending: Family Medicine | Admitting: Family Medicine

## 2018-09-13 ENCOUNTER — Ambulatory Visit
Admission: EM | Admit: 2018-09-13 | Discharge: 2018-09-13 | Disposition: A | Discharge status: Home / Self Care | Payer: Medicare Other | Attending: Family Medicine | Admitting: Family Medicine

## 2018-09-13 ENCOUNTER — Other Ambulatory Visit: Payer: Self-pay

## 2018-09-13 DIAGNOSIS — R1031 Right lower quadrant pain: Secondary | ICD-10-CM | POA: Diagnosis not present

## 2018-09-13 MED ORDER — METHYLPREDNISOLONE SODIUM SUCC 40 MG IJ SOLR
80.0000 mg | Freq: Once | INTRAMUSCULAR | Status: AC
  Administered 2018-09-13: 12:00:00 80 mg via INTRAMUSCULAR

## 2018-09-13 MED ORDER — PREDNISONE 10 MG PO TABS: ORAL_TABLET | ORAL | 0 refills | Status: DC

## 2018-09-13 NOTE — Discharge Instructions (Signed)
Medication as prescribed.  If persists, see PCP or orthopedics - [facility] or [facility].  Take care  Dr. Lacinda Axon

## 2018-09-13 NOTE — ED Provider Notes (Signed)
[facility]    CSN: [id number] Arrival date & time: 09/13/18  1120 History   Chief Complaint Chief Complaint  Patient presents with  . Groin Pain   HPI  78 year old female presents with groin pain.  Patient reports that she first developed right groin pain in January.  It subsequently resolved.  However, it returned in March.  Has been persistent and worsening since that time.  Most notably worse since June.  No fall, trauma, injury.  She has known back issues.  She has seen a chiropractor multiple times and has also been prescribed Celebrex by her primary care physician without significant improvement.  No other medications tried.  Worse with certain activities/movements.  No relieving factors.  She states that her pain is so bad that it is interfering with sleep.  Currently rates her pain is 9/10 in severity.  No other associated symptoms.  No other complaints.  PMH, Surgical Hx, Family Hx, Social History reviewed and updated as below.  Past Medical History:  Diagnosis Date  . DDD (degenerative disc disease), lumbar 12/16/2016   Overview:  status post back surgery x 3.  . DJD (degenerative joint disease) 12/16/2016  . Facet arthritis of lumbar region 09/24/2015  . GERD (gastroesophageal reflux disease) 12/16/2016  . High cholesterol 12/16/2016  . Hypertension   . Hypothyroid   . Internal hemorrhoids 12/16/2016  . Leukopenia 12/16/2016  . Microscopic hematuria 12/16/2016   Overview:  IVP, ultrasound unrevealing 2008. Status post Urology evaluation through Dr. Lonna CobbStoioff.  . Partial small bowel obstruction (HCC) 12/11/2016  . Thyroid disorder 12/16/2016   Overview:  hypothyroidism  . Urethral discharge 05/18/2013  . 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 Urge incontinence 05/18/2013  . Vaginal lesion 06/24/2013    Patient Active Problem List   Diagnosis Date Noted  . Intestinal adhesions with complete obstruction (HCC)   . DDD (degenerative disc disease), lumbar 12/16/2016   . DJD (degenerative joint disease) 12/16/2016  . GERD (gastroesophageal reflux disease) 12/16/2016  . High cholesterol 12/16/2016  . Hypertension 12/16/2016  . Internal hemorrhoids 12/16/2016  . Leukopenia 12/16/2016  . Microscopic hematuria 12/16/2016  . Thyroid disorder 12/16/2016  . Partial small bowel obstruction (HCC) 12/11/2016  . Facet arthritis of lumbar region 09/24/2015  . Vaginal lesion 06/24/2013  . Urethral discharge 05/18/2013  . 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 Urge incontinence 05/18/2013    Past Surgical History:  Procedure Laterality Date  . ABDOMINAL HYSTERECTOMY    . BACK SURGERY    . CHOLECYSTECTOMY      OB History   No obstetric history on file.      Home Medications    Prior to Admission medications   Medication Sig Start Date End Date Taking? Authorizing Provider  chlorthalidone (HYGROTON) 25 MG tablet Take 25 mg by mouth daily. 08/04/18  Yes [provider]  levothyroxine (SYNTHROID, LEVOTHROID) 75 MCG tablet Take 75 mcg by mouth daily before breakfast. 30 minutes before breakfast 03/31/16  Yes [provider]  losartan (COZAAR) 50 MG tablet TAKE 1 TABLET DAILY 02/21/16  Yes [provider]  omeprazole (PRILOSEC) 20 MG capsule Take by mouth. 09/24/15  Yes [provider]  rosuvastatin (CRESTOR) 10 MG tablet Take by mouth. 05/02/16 09/13/18 Yes [provider]  predniSONE (DELTASONE) 10 MG tablet 50 mg daily x 2 days, then 40 mg daily x 2 days, then 30 mg daily x 2 days, then 20 mg daily x 2 days, then 10 mg daily x 2 days. 09/13/18  Addelyn Alleman G, DO  albuterol (PROVENTIL HFA;VENTOLIN HFA) 108 (90 Base) MCG/ACT inhaler Inhale into the lungs every 6 (six) hours as needed for wheezing or shortness of breath.  09/13/18  [provider]    Family History Family History  Problem Relation Age of Onset  . Hyperlipidemia Mother   . Rectal cancer Mother   . Other Father        killed in 98 War II     Social History Social History   Tobacco Use  . Smoking status: Never Smoker  . Smokeless tobacco: Never Used  Substance Use Topics  . Alcohol use: Yes    Alcohol/week: 3.0 standard drinks    Types: 3 Glasses of wine per week  . Drug use: No     Allergies   Amlodipine   Review of Systems Review of Systems  Constitutional: Negative.   Musculoskeletal:       Right groin pain.   Physical Exam Triage Vital Signs ED Triage Vitals  Enc Vitals Group     BP 09/13/18 1156 (!) 155/99     Pulse Rate 09/13/18 1156 60     Resp 09/13/18 1156 16     Temp 09/13/18 1156 98.4 F (36.9 C)     Temp Source 09/13/18 1156 Oral     SpO2 09/13/18 1156 98 %     Weight 09/13/18 1153 168 lb (76.2 kg)     Height 09/13/18 1153 5\' 3"  (1.6 m)     Head Circumference --      Peak Flow --      Pain Score 09/13/18 1153 9     Pain Loc --      Pain Edu? --      Excl. in [place]? --    Updated Vital Signs BP (!) 155/99 (BP Location: Left Arm)   Pulse 60   Temp 98.4 F (36.9 C) (Oral)   Resp 16   Ht 5\' 3"  (1.6 m)   Wt 76.2 kg   SpO2 98%   BMI 29.76 kg/m   Visual Acuity Right Eye Distance:   Left Eye Distance:   Bilateral Distance:    Right Eye Near:   Left Eye Near:    Bilateral Near:     Physical Exam Vitals signs and nursing note reviewed.  Constitutional:      General: She is not in acute distress.    Appearance: Normal appearance.  HENT:     Head: Normocephalic and atraumatic.  Eyes:     General:        Right eye: No discharge.        Left eye: No discharge.     Conjunctiva/sclera: Conjunctivae normal.  Cardiovascular:     Rate and Rhythm: Normal rate and regular rhythm.  Pulmonary:     Effort: Pulmonary effort is normal.     Breath sounds: Normal breath sounds.  Musculoskeletal:     Comments: Right hip - Pain with FADIR. Fair ROM. No greater trochanter tenderness to palpation.   Neurological:     Mental Status: She is alert.  Psychiatric:        Mood and Affect: Mood  normal.        Behavior: Behavior normal.    UC Treatments / Results  Labs (all labs ordered are listed, but only abnormal results are displayed) Labs Reviewed - No data to display  EKG   Radiology No results found.  Procedures Procedures (including critical care time)  Medications Ordered in  UC Medications  methylPREDNISolone sodium succinate (SOLU-MEDROL) 40 mg/mL injection 80 mg (80 mg Intramuscular Given 09/13/18 1217)    Initial Impression / Assessment and Plan / UC Course  I have reviewed the triage vital signs and the nursing notes.  Pertinent labs & imaging results that were available during my care of the patient were reviewed by me and considered in my medical decision making (see chart for details).    78 year old female presents with right groin pain.  She is had x-rays performed by chiropractor which seemed to be okay.  Images are poor but joint space seems to be preserved.  IM Depo-Medrol given today.  Placing a burst of steroids.  Advised to see orthopedics.  Final Clinical Impressions(s) / UC Diagnoses   Final diagnoses:  Right groin pain     Discharge Instructions     Medication as prescribed.  If persists, see PCP or orthopedics - [facility] or [facility].  Take care  Dr. Adriana Simasook    ED Prescriptions    Medication Sig Dispense Auth. Provider   predniSONE (DELTASONE) 10 MG tablet 50 mg daily x 2 days, then 40 mg daily x 2 days, then 30 mg daily x 2 days, then 20 mg daily x 2 days, then 10 mg daily x 2 days. 30 tablet Tommie Samsook, Chandan Fly G, DO     Controlled Substance Prescriptions [place] Controlled Substance Registry consulted? Not Applicable   Tommie SamsCook, Lateshia Schmoker G, DO 09/13/18 1244

## 2018-09-13 NOTE — ED Triage Notes (Signed)
Patient complains of groin pain since January but was intermittent. Patient states that over the last few weeks the pain has been constant and makes her hip and leg hurt. Patient states that she has had 11 visits with chiropractor but saw no relief. She states that her PCP gave her a 9 day course of Celebrex but experienced no relief.

## 2018-09-23 ENCOUNTER — Other Ambulatory Visit: Payer: Self-pay | Admitting: Sports Medicine

## 2018-09-23 DIAGNOSIS — M25551 Pain in right hip: Secondary | ICD-10-CM

## 2018-09-23 DIAGNOSIS — M1611 Unilateral primary osteoarthritis, right hip: Secondary | ICD-10-CM

## 2018-09-28 ENCOUNTER — Other Ambulatory Visit
Admission: RE | Admit: 2018-09-28 | Discharge: 2018-09-28 | Disposition: A | Discharge status: Home / Self Care | Payer: Medicare Other | Source: Ambulatory Visit | Attending: Unknown Physician Specialty | Admitting: Unknown Physician Specialty

## 2018-10-05 ENCOUNTER — Ambulatory Visit
Admission: RE | Admit: 2018-10-05 | Discharge: 2018-10-05 | Disposition: A | Discharge status: Home / Self Care | Payer: Medicare Other | Source: Ambulatory Visit | Attending: Sports Medicine | Admitting: Sports Medicine

## 2018-10-05 ENCOUNTER — Other Ambulatory Visit: Payer: Self-pay

## 2018-10-05 DIAGNOSIS — M25551 Pain in right hip: Secondary | ICD-10-CM

## 2018-10-05 DIAGNOSIS — M1611 Unilateral primary osteoarthritis, right hip: Secondary | ICD-10-CM | POA: Diagnosis present

## 2018-10-14 ENCOUNTER — Other Ambulatory Visit
Admission: RE | Admit: 2018-10-14 | Discharge: 2018-10-14 | Disposition: A | Discharge status: Home / Self Care | Payer: Medicare Other | Source: Ambulatory Visit | Attending: Internal Medicine | Admitting: Internal Medicine

## 2018-10-14 ENCOUNTER — Other Ambulatory Visit: Payer: Self-pay

## 2018-10-14 DIAGNOSIS — Z01812 Encounter for preprocedural laboratory examination: Secondary | ICD-10-CM | POA: Insufficient documentation

## 2018-10-14 DIAGNOSIS — Z20828 Contact with and (suspected) exposure to other viral communicable diseases: Secondary | ICD-10-CM | POA: Insufficient documentation

## 2018-10-14 LAB — SARS CORONAVIRUS 2 (TAT 6-24 HRS): SARS Coronavirus 2: NEGATIVE

## 2018-10-17 NOTE — H&P (Signed)
Outpatient short stay form Pre-procedure 10/17/2018 5:43 PM Laurie [name] Laurie [name], M.D.  Primary Physician: Ramonita Lab III, M.D.  Reason for visit:  Personal hx of colon polyps.  History of present illness:                            Patient presents for colonoscopy for a personal hx of colon polyps (TA polyp x1 in cecum in 2015).. The patient denies abdominal pain, abnormal weight loss or rectal bleeding.    No current facility-administered medications for this encounter.   Current Outpatient Medications:  .  aspirin EC 81 MG tablet, Take 81 mg by mouth daily., Disp: , Rfl:  .  chlorthalidone (HYGROTON) 25 MG tablet, Take 25 mg by mouth daily., Disp: , Rfl:  .  levothyroxine (SYNTHROID, LEVOTHROID) 75 MCG tablet, Take 75 mcg by mouth daily before breakfast. 30 minutes before breakfast, Disp: , Rfl:  .  losartan (COZAAR) 50 MG tablet, TAKE 1 TABLET DAILY, Disp: , Rfl:  .  omeprazole (PRILOSEC) 20 MG capsule, Take by mouth., Disp: , Rfl:  .  predniSONE (DELTASONE) 10 MG tablet, 50 mg daily x 2 days, then 40 mg daily x 2 days, then 30 mg daily x 2 days, then 20 mg daily x 2 days, then 10 mg daily x 2 days., Disp: 30 tablet, Rfl: 0 .  rosuvastatin (CRESTOR) 10 MG tablet, Take by mouth., Disp: , Rfl:   No medications prior to admission.     Allergies  Allergen Reactions  . Amlodipine Swelling     Past Medical History:  Diagnosis Date  . DDD (degenerative disc disease), lumbar 12/16/2016   Overview:  status post back surgery x 3.  . DJD (degenerative joint disease) 12/16/2016  . Facet arthritis of lumbar region 09/24/2015  . GERD (gastroesophageal reflux disease) 12/16/2016  . High cholesterol 12/16/2016  . Hypertension   . Hypothyroid   . Internal hemorrhoids 12/16/2016  . Leukopenia 12/16/2016  . Microscopic hematuria 12/16/2016   Overview:  IVP, ultrasound unrevealing 2008. Status post Urology evaluation through Dr. Bernardo Heater.  . Partial small bowel obstruction ([place]) 12/11/2016  .  Thyroid disorder 12/16/2016   Overview:  hypothyroidism  . Urethral discharge 05/18/2013  . 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 Urge incontinence 05/18/2013  . Vaginal lesion 06/24/2013    Review of systems:  Otherwise negative.    Physical Exam  Gen: Alert, oriented. Appears stated age.  HEENT: [place]/AT. PERRLA. Lungs: CTA, no wheezes. CV: RR nl S1, S2. Abd: soft, benign, no masses. BS+ Ext: No edema. Pulses 2+    Planned procedures: Proceed with colonoscopy. The patient understands the nature of the planned procedure, indications, risks, alternatives and potential complications including but not limited to bleeding, infection, perforation, damage to internal organs and possible oversedation/side effects from anesthesia. The patient agrees and gives consent to proceed.  Please refer to procedure notes for findings, recommendations and patient disposition/instructions.     [name] Laurie [name], M.D. Gastroenterology 10/17/2018  5:43 PM

## 2018-10-18 ENCOUNTER — Other Ambulatory Visit: Payer: Self-pay

## 2018-10-18 ENCOUNTER — Ambulatory Visit: Payer: Medicare Other | Admitting: Anesthesiology

## 2018-10-18 ENCOUNTER — Encounter
Admission: RE | Disposition: A | Discharge status: Home / Self Care | Payer: Self-pay | Source: Ambulatory Visit | Attending: Internal Medicine

## 2018-10-18 ENCOUNTER — Ambulatory Visit
Admission: RE | Admit: 2018-10-18 | Discharge: 2018-10-18 | Disposition: A | Discharge status: Home / Self Care | Payer: Medicare Other | Source: Ambulatory Visit | Attending: Internal Medicine | Admitting: Internal Medicine

## 2018-10-18 ENCOUNTER — Encounter: Payer: Self-pay | Admitting: Internal Medicine

## 2018-10-18 DIAGNOSIS — Z1211 Encounter for screening for malignant neoplasm of colon: Secondary | ICD-10-CM | POA: Insufficient documentation

## 2018-10-18 DIAGNOSIS — K219 Gastro-esophageal reflux disease without esophagitis: Secondary | ICD-10-CM | POA: Diagnosis not present

## 2018-10-18 DIAGNOSIS — I10 Essential (primary) hypertension: Secondary | ICD-10-CM | POA: Diagnosis not present

## 2018-10-18 DIAGNOSIS — K222 Esophageal obstruction: Secondary | ICD-10-CM | POA: Diagnosis not present

## 2018-10-18 DIAGNOSIS — Z79899 Other long term (current) drug therapy: Secondary | ICD-10-CM | POA: Insufficient documentation

## 2018-10-18 DIAGNOSIS — R1310 Dysphagia, unspecified: Secondary | ICD-10-CM | POA: Diagnosis not present

## 2018-10-18 DIAGNOSIS — K449 Diaphragmatic hernia without obstruction or gangrene: Secondary | ICD-10-CM | POA: Diagnosis not present

## 2018-10-18 DIAGNOSIS — K642 Third degree hemorrhoids: Secondary | ICD-10-CM | POA: Diagnosis not present

## 2018-10-18 DIAGNOSIS — K2970 Gastritis, unspecified, without bleeding: Secondary | ICD-10-CM | POA: Diagnosis not present

## 2018-10-18 DIAGNOSIS — Z7982 Long term (current) use of aspirin: Secondary | ICD-10-CM | POA: Diagnosis not present

## 2018-10-18 DIAGNOSIS — Z79890 Hormone replacement therapy (postmenopausal): Secondary | ICD-10-CM | POA: Diagnosis not present

## 2018-10-18 DIAGNOSIS — Z86010 Personal history of colonic polyps: Secondary | ICD-10-CM | POA: Diagnosis not present

## 2018-10-18 DIAGNOSIS — E039 Hypothyroidism, unspecified: Secondary | ICD-10-CM | POA: Insufficient documentation

## 2018-10-18 DIAGNOSIS — E7800 Pure hypercholesterolemia, unspecified: Secondary | ICD-10-CM | POA: Diagnosis not present

## 2018-10-18 DIAGNOSIS — Z800 Family history of malignant neoplasm of digestive organs: Secondary | ICD-10-CM | POA: Insufficient documentation

## 2018-10-18 HISTORY — PX: ESOPHAGOGASTRODUODENOSCOPY (EGD) WITH PROPOFOL: SHX5813

## 2018-10-18 HISTORY — PX: COLONOSCOPY WITH PROPOFOL: SHX5780

## 2018-10-18 SURGERY — ESOPHAGOGASTRODUODENOSCOPY (EGD) WITH PROPOFOL
Anesthesia: General

## 2018-10-18 MED ORDER — PROPOFOL 500 MG/50ML IV EMUL
INTRAVENOUS | Status: AC
  Filled 2018-10-18: qty 50

## 2018-10-18 MED ORDER — SODIUM CHLORIDE 0.9 % IV SOLN
INTRAVENOUS | Status: DC
  Administered 2018-10-18: 10:00:00 via INTRAVENOUS

## 2018-10-18 MED ORDER — PROPOFOL 500 MG/50ML IV EMUL
INTRAVENOUS | Status: DC | PRN
  Administered 2018-10-18: 50 ug/kg/min via INTRAVENOUS

## 2018-10-18 MED ORDER — PHENYLEPHRINE HCL (PRESSORS) 10 MG/ML IV SOLN
INTRAVENOUS | Status: DC | PRN
  Administered 2018-10-18 (×2): 50 ug via INTRAVENOUS

## 2018-10-18 MED ORDER — PHENYLEPHRINE HCL (PRESSORS) 10 MG/ML IV SOLN
INTRAVENOUS | Status: AC
  Filled 2018-10-18: qty 1

## 2018-10-18 MED ORDER — LIDOCAINE HCL (CARDIAC) PF 100 MG/5ML IV SOSY
PREFILLED_SYRINGE | INTRAVENOUS | Status: DC | PRN
  Administered 2018-10-18: 40 mg via INTRAVENOUS

## 2018-10-18 MED ORDER — LIDOCAINE HCL (PF) 2 % IJ SOLN
INTRAMUSCULAR | Status: AC
  Filled 2018-10-18: qty 10

## 2018-10-18 MED ORDER — PROPOFOL 10 MG/ML IV BOLUS
INTRAVENOUS | Status: DC | PRN
  Administered 2018-10-18: 30 mg via INTRAVENOUS
  Administered 2018-10-18 (×2): 20 mg via INTRAVENOUS
  Administered 2018-10-18: 30 mg via INTRAVENOUS
  Administered 2018-10-18: 50 mg via INTRAVENOUS

## 2018-10-18 NOTE — Op Note (Signed)
[facility] Gastroenterology Patient Name: Laurie [name] Procedure Date: 10/18/2018 10:07 AM MRN: [id number] Account #: [id number] Date of Birth: [birth year] Admit Type: Outpatient Age: 78 Room: [facility] ENDO ROOM 1 Gender: Female Note Status: Finalized Procedure:            Upper GI endoscopy Indications:          Dysphagia, Follow-up of gastro-esophageal reflux disease Providers:            Boykin Nearing. Norma Fredrickson MD, MD Referring MD:         Daniel Nones, MD (Referring MD) Medicines:            Propofol per Anesthesia Complications:        No immediate complications. Procedure:            Pre-Anesthesia Assessment:                       - The risks and benefits of the procedure and the                        sedation options and risks were discussed with the                        patient. All questions were answered and informed                        consent was obtained.                       - Patient identification and proposed procedure were                        verified prior to the procedure by the nurse. The                        procedure was verified in the procedure room.                       - ASA Grade Assessment: III - A patient with severe                        systemic disease.                       - After reviewing the risks and benefits, the patient                        was deemed in satisfactory condition to undergo the                        procedure.                       After obtaining informed consent, the endoscope was                        passed under direct vision. Throughout the procedure,                        the patient's blood pressure, pulse, and oxygen  saturations were monitored continuously. The Endoscope                        was introduced through the mouth, and advanced to the                        third part of duodenum. The upper GI endoscopy was                        accomplished without  difficulty. The patient tolerated                        the procedure well. Findings:      [Extent] mild mucosal changes characterized by feline appearance were       found in the entire esophagus. Biopsies were obtained from the proximal       and distal esophagus with cold forceps for histology of suspected       eosinophilic esophagitis.      Non-occlusive lower esophageal "B" ring, The scope was withdrawn.       Dilation was performed with a Maloney dilator with no resistance at 54       Fr.      A 1 cm hiatal hernia was present.      Localized mild inflammation characterized by erythema was found in the       gastric antrum.      The examined duodenum was normal.      The exam was otherwise without abnormality. Impression:           - Texture changed, decreased vascular pattern, white                        specked mucosa in the esophagus. Biopsied.                       - 1 cm hiatal hernia.                       - Gastritis.                       - Normal examined duodenum.                       - The examination was otherwise normal. Recommendation:       - Await pathology results.                       - Monitor results to esophageal dilation                       - Proceed with colonoscopy Procedure Code(s):    --- Professional ---                       [id number], Esophagogastroduodenoscopy, flexible, transoral;                        with biopsy, single or multiple                       43450, Dilation of esophagus, by unguided sound or  bougie, single or multiple passes Diagnosis Code(s):    --- Professional ---                       K21.9, Gastro-esophageal reflux disease without                        esophagitis                       R13.10, Dysphagia, unspecified                       K29.70, Gastritis, unspecified, without bleeding                       K44.9, Diaphragmatic hernia without obstruction or                        gangrene                        K22.8, Other specified diseases of esophagus CPT copyright 2019 American Medical Association. All rights reserved. The codes documented in this report are preliminary and upon coder review may  be revised to meet current compliance requirements. Efrain Sella MD, MD 10/18/2018 10:28:57 AM This report has been signed electronically. Number of Addenda: 0 Note Initiated On: 10/18/2018 10:07 AM Estimated Blood Loss: Estimated blood loss: none.      [facility]

## 2018-10-18 NOTE — Transfer of Care (Signed)
Immediate Anesthesia Transfer of Care Note  Patient: Laurie [name]  Procedure(s) Performed: ESOPHAGOGASTRODUODENOSCOPY (EGD) WITH PROPOFOL (N/A ) COLONOSCOPY WITH PROPOFOL (N/A )  Patient Location: PACU  Anesthesia Type:General  Level of Consciousness: awake  Airway & Oxygen Therapy: Patient Spontanous Breathing  Post-op Assessment: Report given to RN and Post -op Vital signs reviewed and stable  Post vital signs: Reviewed and stable  Last Vitals:  Vitals Value Taken Time  BP 89/44 10/18/18 1047  Temp    Pulse 68 10/18/18 1047  Resp 16 10/18/18 1047  SpO2 97 % 10/18/18 1047  Vitals shown include unvalidated device data.  Last Pain:  Vitals:   10/18/18 0925  TempSrc: Tympanic         Complications: No apparent anesthesia complications

## 2018-10-18 NOTE — Op Note (Signed)
[facility]astroenter[facility] Patient Name: Laurie [name] Procedure Date: 10/18/2018 10:07 AM MRN: [id number] Account #: [id number] Date of Birth: 1941/01/01 Admit Type: Outpatient Age: 78 Room: [facility] ENDO ROOM 1 Gender: Female Note Status: Finalized Procedure:            Colonoscopy Indications:          High risk colon cancer surveillance: Personal history                        of colonic polyps Providers:            Boykin Nearing. Norma Fredrickson MD, MD Referring MD:         Daniel Nones, MD (Referring MD) Medicines:            Propofol per Anesthesia Complications:        No immediate complications. Procedure:            Pre-Anesthesia Assessment:                       - The risks and benefits of the procedure and the                        sedation options and risks were discussed with the                        patient. All questions were answered and informed                        consent was obtained.                       - Patient identification and proposed procedure were                        verified prior to the procedure by the nurse. The                        procedure was verified in the procedure room.                       - ASA Grade Assessment: III - A patient with severe                        systemic disease.                       - After reviewing the risks and benefits, the patient                        was deemed in satisfactory condition to undergo the                        procedure.                       After obtaining informed consent, the colonoscope was                        passed under direct vision. Throughout the procedure,  the patient's blood pressure, pulse, and oxygen                        saturations were monitored continuously. The                        Colonoscope was introduced through the anus and                        advanced to the the cecum, identified by appendiceal                        orifice  and ileocecal valve. The colonoscopy was                        somewhat difficult due to significant looping.                        Successful completion of the procedure was aided by                        using scope torsion. The patient tolerated the                        procedure well. The quality of the bowel preparation                        was good. The ileocecal valve, appendiceal orifice, and                        rectum were photographed. Findings:      The perianal and digital rectal examinations were normal. Pertinent       negatives include normal sphincter tone and no palpable rectal lesions.      The colon (entire examined portion) appeared normal.      Non-bleeding internal hemorrhoids were found during retroflexion. The       hemorrhoids were Grade III (internal hemorrhoids that prolapse but       require manual reduction).      The exam was otherwise without abnormality. Impression:           - The entire examined colon is normal.                       - Non-bleeding internal hemorrhoids.                       - The examination was otherwise normal.                       - No specimens collected. Recommendation:       - Patient has a contact number available for                        emergencies. The signs and symptoms of potential                        delayed complications were discussed with the patient.                        Return to normal activities tomorrow. Written discharge  instructions were provided to the patient.                       - Resume previous diet.                       - Continue present medications.                       - Repeat colonoscopy is not recommended for                        surveillance.                       - Patient says she wants another colonoscopy in 5 years                        due to her mother being diagnosed with colon cancer at                        age 80. Will revisit with patient  her decision and                        safety of proceeding in 5 yrs.                       - Return to my office PRN.                       - The findings and recommendations were discussed with                        the patient. Procedure Code(s):    --- Professional ---                       [id number], Colorectal cancer screening; colonoscopy on                        individual at high risk Diagnosis Code(s):    --- Professional ---                       K64.2, Third degree hemorrhoids                       Z86.010, Personal history of colonic polyps CPT copyright 2019 American [employer] Association. All rights reserved. The codes documented in this report are preliminary and upon coder review may  be revised to meet current compliance requirements. Stanton Kidneyeodoro K  MD, MD 10/18/2018 10:48:38 AM This report has been signed electronically. Number of Addenda: 0 Note Initiated On: 10/18/2018 10:07 AM Scope Withdrawal Time: 0 hours 6 minutes 14 seconds  Total Procedure Duration: 0 hours 11 minutes 57 seconds  Estimated Blood Loss: Estimated blood loss: none.      [facility]

## 2018-10-18 NOTE — Anesthesia Preprocedure Evaluation (Signed)
Anesthesia Evaluation  Patient identified by MRN, date of birth, ID band Patient awake    Reviewed: Allergy & Precautions, H&P , NPO status , Patient's Chart, lab work & pertinent test results, reviewed documented beta blocker date and time   Airway Mallampati: II   Neck ROM: full    Dental  (+) Poor Dentition   Pulmonary neg pulmonary ROS,    Pulmonary exam normal        Cardiovascular Exercise Tolerance: Good hypertension, On Medications negative cardio ROS Normal cardiovascular exam Rhythm:regular Rate:Normal     Neuro/Psych negative neurological ROS  negative psych ROS   GI/Hepatic Neg liver ROS, GERD  Medicated,  Endo/Other  Hypothyroidism   Renal/GU negative Renal ROS  negative genitourinary   Musculoskeletal   Abdominal   Peds  Hematology negative hematology ROS (+)   Anesthesia Other Findings Past Medical History: 12/16/2016: DDD (degenerative disc disease), lumbar     Comment:  Overview:  status post back surgery x 3. 12/16/2016: DJD (degenerative joint disease) 09/24/2015: Facet arthritis of lumbar region 12/16/2016: GERD (gastroesophageal reflux disease) 12/16/2016: High cholesterol No date: Hypertension No date: Hypothyroid 12/16/2016: Internal hemorrhoids 12/16/2016: Leukopenia 12/16/2016: Microscopic hematuria     Comment:  Overview:  IVP, ultrasound unrevealing 2008. Status post              Urology evaluation through Dr. Bernardo Heater. 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 05/18/2013: Urge incontinence 06/24/2013: Vaginal lesion Past Surgical History: No date: ABDOMINAL HYSTERECTOMY No date: BACK SURGERY No date: CHOLECYSTECTOMY BMI    Body Mass Index: 29.76 kg/m     Reproductive/Obstetrics negative OB ROS                              Anesthesia Physical Anesthesia Plan  ASA: III  Anesthesia Plan: General   Post-op Pain Management:    Induction:   PONV Risk Score and Plan:   Airway Management Planned:   Additional Equipment:   Intra-op Plan:   Post-operative Plan:   Informed Consent: I have reviewed the patients History and Physical, chart, labs and discussed the procedure including the risks, benefits and alternatives for the proposed anesthesia with the patient or authorized representative who has indicated his/her understanding and acceptance.     Dental Advisory Given  Plan Discussed with: CRNA  Anesthesia Plan Comments:         Anesthesia Quick Evaluation

## 2018-10-18 NOTE — Interval H&P Note (Signed)
History and Physical Interval Note:  10/18/2018 10:06 AM  Laurie [name]  has presented today for surgery, with the diagnosis of GERD, History of Colon Polyps.  The various methods of treatment have been discussed with the patient and family. After consideration of risks, benefits and other options for treatment, the patient has consented to  Procedure(s): ESOPHAGOGASTRODUODENOSCOPY (EGD) WITH PROPOFOL (N/A) COLONOSCOPY WITH PROPOFOL (N/A) as a surgical intervention.  The patient's history has been reviewed, patient examined, no change in status, stable for surgery.  I have reviewed the patient's chart and labs.  Questions were answered to the patient's satisfaction.     [place], [place]

## 2018-10-18 NOTE — Anesthesia Post-op Follow-up Note (Signed)
Anesthesia QCDR form completed.        

## 2018-10-20 LAB — SURGICAL PATHOLOGY

## 2018-10-20 NOTE — Anesthesia Postprocedure Evaluation (Signed)
Anesthesia Post Note  Patient: AUDRY KAUZLARICH  Procedure(s) Performed: ESOPHAGOGASTRODUODENOSCOPY (EGD) WITH PROPOFOL (N/A ) COLONOSCOPY WITH PROPOFOL (N/A )  Patient location during evaluation: PACU Anesthesia Type: General Level of consciousness: awake and alert Pain management: pain level controlled Vital Signs Assessment: post-procedure vital signs reviewed and stable Respiratory status: spontaneous breathing, nonlabored ventilation, respiratory function stable and patient connected to nasal cannula oxygen Cardiovascular status: blood pressure returned to baseline and stable Postop Assessment: no apparent nausea or vomiting Anesthetic complications: no     Last Vitals:  Vitals:   10/18/18 1126 10/18/18 1127  BP: (!) 141/79 (!) 141/79  Pulse: 66 65  Resp: 14 17  Temp:    SpO2: 100% 98%    Last Pain:  Vitals:   10/19/18 0726  TempSrc:   PainSc: 0-No pain                 Molli Barrows

## 2019-01-21 ENCOUNTER — Other Ambulatory Visit: Payer: Self-pay | Admitting: Sports Medicine

## 2019-01-21 DIAGNOSIS — M1711 Unilateral primary osteoarthritis, right knee: Secondary | ICD-10-CM

## 2019-01-21 DIAGNOSIS — G8929 Other chronic pain: Secondary | ICD-10-CM

## 2019-01-21 DIAGNOSIS — M76891 Other specified enthesopathies of right lower limb, excluding foot: Secondary | ICD-10-CM

## 2019-01-29 ENCOUNTER — Ambulatory Visit
Admission: RE | Admit: 2019-01-29 | Discharge: 2019-01-29 | Disposition: A | Discharge status: Home / Self Care | Payer: Medicare Other | Source: Ambulatory Visit | Attending: Sports Medicine | Admitting: Sports Medicine

## 2019-01-29 DIAGNOSIS — M76891 Other specified enthesopathies of right lower limb, excluding foot: Secondary | ICD-10-CM | POA: Insufficient documentation

## 2019-01-29 DIAGNOSIS — M25561 Pain in right knee: Secondary | ICD-10-CM | POA: Insufficient documentation

## 2019-01-29 DIAGNOSIS — M1711 Unilateral primary osteoarthritis, right knee: Secondary | ICD-10-CM | POA: Insufficient documentation

## 2019-01-29 DIAGNOSIS — G8929 Other chronic pain: Secondary | ICD-10-CM | POA: Diagnosis present

## 2019-02-02 ENCOUNTER — Ambulatory Visit: Payer: Medicare Other

## 2019-10-19 ENCOUNTER — Other Ambulatory Visit: Payer: Self-pay

## 2019-10-19 ENCOUNTER — Encounter: Payer: Self-pay | Admitting: Ophthalmology

## 2019-10-21 ENCOUNTER — Other Ambulatory Visit: Admission: RE | Admit: 2019-10-21 | Payer: Medicare Other | Source: Ambulatory Visit

## 2019-10-25 ENCOUNTER — Other Ambulatory Visit: Payer: Self-pay

## 2019-10-25 ENCOUNTER — Other Ambulatory Visit
Admission: RE | Admit: 2019-10-25 | Discharge: 2019-10-25 | Disposition: A | Discharge status: Home / Self Care | Payer: Medicare Other | Source: Ambulatory Visit | Attending: Ophthalmology | Admitting: Ophthalmology

## 2019-10-25 DIAGNOSIS — Z01812 Encounter for preprocedural laboratory examination: Secondary | ICD-10-CM | POA: Diagnosis present

## 2019-10-25 DIAGNOSIS — Z20822 Contact with and (suspected) exposure to covid-19: Secondary | ICD-10-CM | POA: Diagnosis not present

## 2019-10-25 NOTE — Discharge Instructions (Signed)

## 2019-10-26 ENCOUNTER — Ambulatory Visit: Payer: Medicare Other | Admitting: Anesthesiology

## 2019-10-26 ENCOUNTER — Other Ambulatory Visit: Payer: Self-pay

## 2019-10-26 ENCOUNTER — Encounter
Admission: RE | Disposition: A | Discharge status: Home / Self Care | Payer: Self-pay | Source: Home / Self Care | Attending: Ophthalmology

## 2019-10-26 ENCOUNTER — Ambulatory Visit
Admission: RE | Admit: 2019-10-26 | Discharge: 2019-10-26 | Disposition: A | Discharge status: Home / Self Care | Payer: Medicare Other | Attending: Ophthalmology | Admitting: Ophthalmology

## 2019-10-26 ENCOUNTER — Encounter: Payer: Self-pay | Admitting: Ophthalmology

## 2019-10-26 DIAGNOSIS — Z79899 Other long term (current) drug therapy: Secondary | ICD-10-CM | POA: Diagnosis not present

## 2019-10-26 DIAGNOSIS — H2511 Age-related nuclear cataract, right eye: Secondary | ICD-10-CM | POA: Insufficient documentation

## 2019-10-26 DIAGNOSIS — I10 Essential (primary) hypertension: Secondary | ICD-10-CM | POA: Diagnosis not present

## 2019-10-26 DIAGNOSIS — M1990 Unspecified osteoarthritis, unspecified site: Secondary | ICD-10-CM | POA: Diagnosis not present

## 2019-10-26 DIAGNOSIS — Z7982 Long term (current) use of aspirin: Secondary | ICD-10-CM | POA: Insufficient documentation

## 2019-10-26 DIAGNOSIS — Z79890 Hormone replacement therapy (postmenopausal): Secondary | ICD-10-CM | POA: Diagnosis not present

## 2019-10-26 DIAGNOSIS — E039 Hypothyroidism, unspecified: Secondary | ICD-10-CM | POA: Insufficient documentation

## 2019-10-26 DIAGNOSIS — K219 Gastro-esophageal reflux disease without esophagitis: Secondary | ICD-10-CM | POA: Diagnosis not present

## 2019-10-26 DIAGNOSIS — E7800 Pure hypercholesterolemia, unspecified: Secondary | ICD-10-CM | POA: Insufficient documentation

## 2019-10-26 HISTORY — DX: Dizziness and giddiness: R42

## 2019-10-26 HISTORY — PX: CATARACT EXTRACTION W/PHACO: SHX586

## 2019-10-26 LAB — SARS CORONAVIRUS 2 (TAT 6-24 HRS): SARS Coronavirus 2: NEGATIVE

## 2019-10-26 SURGERY — PHACOEMULSIFICATION, CATARACT, WITH IOL INSERTION
Anesthesia: Monitor Anesthesia Care | Site: Eye | Laterality: Right

## 2019-10-26 MED ORDER — CEFUROXIME OPHTHALMIC INJECTION 1 MG/0.1 ML
INJECTION | OPHTHALMIC | Status: DC | PRN
  Administered 2019-10-26: 0.1 mL via INTRACAMERAL

## 2019-10-26 MED ORDER — LIDOCAINE HCL (PF) 2 % IJ SOLN
INTRAOCULAR | Status: DC | PRN
  Administered 2019-10-26: 1 mL

## 2019-10-26 MED ORDER — NA HYALUR & NA CHOND-NA HYALUR 0.4-0.35 ML IO KIT
PACK | INTRAOCULAR | Status: DC | PRN
  Administered 2019-10-26: 1 mL via INTRAOCULAR

## 2019-10-26 MED ORDER — TETRACAINE HCL 0.5 % OP SOLN
1.0000 [drp] | OPHTHALMIC | Status: DC | PRN
  Administered 2019-10-26 (×3): 1 [drp] via OPHTHALMIC

## 2019-10-26 MED ORDER — LACTATED RINGERS IV SOLN: INTRAVENOUS | Status: DC

## 2019-10-26 MED ORDER — EPINEPHRINE PF 1 MG/ML IJ SOLN
INTRAOCULAR | Status: DC | PRN
  Administered 2019-10-26: 51 mL via OPHTHALMIC

## 2019-10-26 MED ORDER — MOXIFLOXACIN HCL 0.5 % OP SOLN
1.0000 [drp] | OPHTHALMIC | Status: DC | PRN
  Administered 2019-10-26 (×3): 1 [drp] via OPHTHALMIC

## 2019-10-26 MED ORDER — ARMC OPHTHALMIC DILATING DROPS
1.0000 "application " | OPHTHALMIC | Status: DC | PRN
  Administered 2019-10-26 (×3): 1 via OPHTHALMIC

## 2019-10-26 MED ORDER — BRIMONIDINE TARTRATE-TIMOLOL 0.2-0.5 % OP SOLN
OPHTHALMIC | Status: DC | PRN
  Administered 2019-10-26: 1 [drp] via OPHTHALMIC

## 2019-10-26 MED ORDER — FENTANYL CITRATE (PF) 100 MCG/2ML IJ SOLN
INTRAMUSCULAR | Status: DC | PRN
Start: 2019-10-26 — End: 2019-10-26
  Administered 2019-10-26 (×2): 50 ug via INTRAVENOUS

## 2019-10-26 MED ORDER — MIDAZOLAM HCL 2 MG/2ML IJ SOLN
INTRAMUSCULAR | Status: DC | PRN
  Administered 2019-10-26: 2 mg via INTRAVENOUS

## 2019-10-26 SURGICAL SUPPLY — 29 items
CANNULA ANT/CHMB 27G (MISCELLANEOUS) ×1 IMPLANT
CANNULA ANT/CHMB 27GA (MISCELLANEOUS) ×3 IMPLANT
GLOVE SURG LX 7.5 STRW (GLOVE) ×4
GLOVE SURG LX STRL 7.5 STRW (GLOVE) ×1 IMPLANT
GLOVE SURG TRIUMPH 8.0 PF LTX (GLOVE) ×3 IMPLANT
GOWN STRL REUS W/ TWL LRG LVL3 (GOWN DISPOSABLE) ×2 IMPLANT
GOWN STRL REUS W/TWL LRG LVL3 (GOWN DISPOSABLE) ×6
LENS IOL ACRSF IQ ULTRA 23.5 (Intraocular Lens) IMPLANT
LENS IOL ACRYSOF IQ 23.5 (Intraocular Lens) ×3 IMPLANT
MARKER SKIN DUAL TIP RULER LAB (MISCELLANEOUS) ×3 IMPLANT
NDL CAPSULORHEX 25GA (NEEDLE) ×1 IMPLANT
NDL FILTER BLUNT 18X1 1/2 (NEEDLE) ×2 IMPLANT
NDL RETROBULBAR .5 NSTRL (NEEDLE) IMPLANT
NEEDLE CAPSULORHEX 25GA (NEEDLE) ×3 IMPLANT
NEEDLE FILTER BLUNT 18X 1/2SAF (NEEDLE) ×4
NEEDLE FILTER BLUNT 18X1 1/2 (NEEDLE) ×2 IMPLANT
PACK CATARACT BRASINGTON (MISCELLANEOUS) ×3 IMPLANT
PACK EYE AFTER SURG (MISCELLANEOUS) ×3 IMPLANT
PACK OPTHALMIC (MISCELLANEOUS) ×3 IMPLANT
RING MALYGIN 7.0 (MISCELLANEOUS) IMPLANT
SOLUTION OPHTHALMIC SALT (MISCELLANEOUS) ×3 IMPLANT
SUT ETHILON 10-0 CS-B-6CS-B-6 (SUTURE)
SUT VICRYL  9 0 (SUTURE)
SUT VICRYL 9 0 (SUTURE) IMPLANT
SUTURE EHLN 10-0 CS-B-6CS-B-6 (SUTURE) IMPLANT
SYR 3ML LL SCALE MARK (SYRINGE) ×6 IMPLANT
SYR TB 1ML LUER SLIP (SYRINGE) ×3 IMPLANT
WATER STERILE IRR 250ML POUR (IV SOLUTION) ×3 IMPLANT
WIPE NON LINTING 3.25X3.25 (MISCELLANEOUS) ×3 IMPLANT

## 2019-10-26 NOTE — Op Note (Signed)
LOCATION:  [facility]   PREOPERATIVE DIAGNOSIS:    Nuclear sclerotic cataract right eye. H25.11   POSTOPERATIVE DIAGNOSIS:  Nuclear sclerotic cataract right eye.     PROCEDURE:  Phacoemusification with posterior chamber intraocular lens placement of the right eye   ULTRASOUND TIME: Procedure(s): CATARACT EXTRACTION PHACO AND INTRAOCULAR LENS PLACEMENT (IOC) RIGHT 5.32  00:47.9  11.1% (Right)  LENS:   Implant Name Type Inv. Item Serial No. Manufacturer Lot No. LRB No. Used Action  LENS IOL ACRYSOF IQ 23.5 - [id number] Intraocular Lens LENS IOL ACRYSOF IQ 23.5 [id number] ALCON  Right 1 Implanted         SURGEON:  Deirdre Evener, MD   ANESTHESIA:  Topical with tetracaine drops and 2% Xylocaine jelly, augmented with 1% preservative-free intracameral lidocaine.    COMPLICATIONS:  None.   DESCRIPTION OF PROCEDURE:  The patient was identified in the holding room and transported to the operating room and placed in the supine position under the operating microscope.  The right eye was identified as the operative eye and it was prepped and draped in the usual sterile ophthalmic fashion.   A 1 millimeter clear-corneal paracentesis was made at the 12:00 position.  0.5 ml of preservative-free 1% lidocaine was injected into the anterior chamber. The anterior chamber was filled with Viscoat viscoelastic.  A 2.4 millimeter keratome was used to make a near-clear corneal incision at the 9:00 position.  A curvilinear capsulorrhexis was made with a cystotome and capsulorrhexis forceps.  Balanced salt solution was used to hydrodissect and hydrodelineate the nucleus.   Phacoemulsification was then used in stop and chop fashion to remove the lens nucleus and epinucleus.  The remaining cortex was then removed using the irrigation and aspiration handpiece. Provisc was then placed into the capsular bag to distend it for lens placement.  A lens was then injected into the capsular bag.  The  remaining viscoelastic was aspirated.   Wounds were hydrated with balanced salt solution.  The anterior chamber was inflated to a physiologic pressure with balanced salt solution.  No wound leaks were noted. Cefuroxime 0.1 ml of a 10mg /ml solution was injected into the anterior chamber for a dose of 1 mg of intracameral antibiotic at the completion of the case.   Timolol and Brimonidine drops were applied to the eye.  The patient was taken to the recovery room in stable condition without complications of anesthesia or surgery.   Raylie Maddison 10/26/2019, 11:10 AM

## 2019-10-26 NOTE — Anesthesia Procedure Notes (Signed)
Procedure Name: MAC Date/Time: 10/26/2019 10:49 AM Performed by: Jeannene Patella, CRNA Pre-anesthesia Checklist: Patient identified, Emergency Drugs available, Suction available, Timeout performed and Patient being monitored Patient Re-evaluated:Patient Re-evaluated prior to induction Oxygen Delivery Method: Nasal cannula Placement Confirmation: positive ETCO2

## 2019-10-26 NOTE — H&P (Signed)

## 2019-10-26 NOTE — Anesthesia Preprocedure Evaluation (Signed)
Anesthesia Evaluation  Patient identified by MRN, date of birth, ID band Patient awake    Reviewed: Allergy & Precautions, NPO status , Patient's Chart, lab work & pertinent test results  History of Anesthesia Complications (+) history of anesthetic complications  Airway Mallampati: II  TM Distance: >3 FB Neck ROM: Full    Dental no notable dental hx.    Pulmonary neg pulmonary ROS,    Pulmonary exam normal        Cardiovascular hypertension, Normal cardiovascular exam     Neuro/Psych negative neurological ROS  negative psych ROS   GI/Hepatic GERD  Controlled,  Endo/Other  Hypothyroidism   Renal/GU      Musculoskeletal  (+) Arthritis , Osteoarthritis,  Back pain   Abdominal Normal abdominal exam  (+)   Peds  Hematology negative hematology ROS (+)   Anesthesia Other Findings   Reproductive/Obstetrics                             Anesthesia Physical Anesthesia Plan  ASA: II  Anesthesia Plan: MAC   Post-op Pain Management:    Induction: Intravenous  PONV Risk Score and Plan: 2 and TIVA, Midazolam and Treatment may vary due to age or medical condition  Airway Management Planned: Natural Airway and Nasal Cannula  Additional Equipment: None  Intra-op Plan:   Post-operative Plan:   Informed Consent: I have reviewed the patients History and Physical, chart, labs and discussed the procedure including the risks, benefits and alternatives for the proposed anesthesia with the patient or authorized representative who has indicated his/her understanding and acceptance.     Dental advisory given  Plan Discussed with: CRNA  Anesthesia Plan Comments:         Anesthesia Quick Evaluation

## 2019-10-26 NOTE — Transfer of Care (Signed)
Immediate Anesthesia Transfer of Care Note  Patient: Laurie [name]  Procedure(s) Performed: CATARACT EXTRACTION PHACO AND INTRAOCULAR LENS PLACEMENT (IOC) RIGHT 5.32  00:47.9  11.1% (Right Eye)  Patient Location: PACU  Anesthesia Type: MAC  Level of Consciousness: awake, alert  and patient cooperative  Airway and Oxygen Therapy: Patient Spontanous Breathing and Patient connected to supplemental oxygen  Post-op Assessment: Post-op Vital signs reviewed, Patient's Cardiovascular Status Stable, Respiratory Function Stable, Patent Airway and No signs of Nausea or vomiting  Post-op Vital Signs: Reviewed and stable  Complications: No complications documented.

## 2019-10-26 NOTE — Anesthesia Postprocedure Evaluation (Signed)
Anesthesia Post Note  Patient: Laurie [name]  Procedure(s) Performed: CATARACT EXTRACTION PHACO AND INTRAOCULAR LENS PLACEMENT (IOC) RIGHT 5.32  00:47.9  11.1% (Right Eye)     Anesthesia Post Evaluation No complications documented.  Valin Massie [employer]

## 2019-10-27 ENCOUNTER — Encounter: Payer: Self-pay | Admitting: Ophthalmology

## 2019-11-14 ENCOUNTER — Encounter: Payer: Self-pay | Admitting: Ophthalmology

## 2019-11-14 ENCOUNTER — Other Ambulatory Visit: Payer: Self-pay

## 2019-11-14 NOTE — Anesthesia Preprocedure Evaluation (Addendum)
Anesthesia Evaluation  Patient identified by MRN, date of birth, ID band Patient awake    Reviewed: Allergy & Precautions, NPO status , Patient's Chart, lab work & pertinent test results  History of Anesthesia Complications Negative for: history of anesthetic complications  Airway Mallampati: I   Neck ROM: Full    Dental no notable dental hx.    Pulmonary neg pulmonary ROS,    Pulmonary exam normal breath sounds clear to auscultation       Cardiovascular hypertension, Normal cardiovascular exam Rhythm:Regular Rate:Normal     Neuro/Psych Vertigo     GI/Hepatic GERD  ,  Endo/Other  Hypothyroidism   Renal/GU negative Renal ROS     Musculoskeletal  (+) Arthritis ,   Abdominal   Peds  Hematology negative hematology ROS (+)   Anesthesia Other Findings   Reproductive/Obstetrics                            Anesthesia Physical Anesthesia Plan  ASA: II  Anesthesia Plan: MAC   Post-op Pain Management:    Induction: Intravenous  PONV Risk Score and Plan: 2 and TIVA, Midazolam and Treatment may vary due to age or medical condition  Airway Management Planned: Nasal Cannula  Additional Equipment:   Intra-op Plan:   Post-operative Plan:   Informed Consent: I have reviewed the patients History and Physical, chart, labs and discussed the procedure including the risks, benefits and alternatives for the proposed anesthesia with the patient or authorized representative who has indicated his/her understanding and acceptance.       Plan Discussed with: CRNA  Anesthesia Plan Comments:        Anesthesia Quick Evaluation

## 2019-11-21 ENCOUNTER — Other Ambulatory Visit
Admission: RE | Admit: 2019-11-21 | Discharge: 2019-11-21 | Disposition: A | Discharge status: Home / Self Care | Payer: Medicare Other | Source: Ambulatory Visit | Attending: Ophthalmology | Admitting: Ophthalmology

## 2019-11-21 ENCOUNTER — Other Ambulatory Visit: Payer: Self-pay

## 2019-11-21 DIAGNOSIS — Z01812 Encounter for preprocedural laboratory examination: Secondary | ICD-10-CM | POA: Insufficient documentation

## 2019-11-21 DIAGNOSIS — Z20822 Contact with and (suspected) exposure to covid-19: Secondary | ICD-10-CM | POA: Diagnosis not present

## 2019-11-21 LAB — SARS CORONAVIRUS 2 (TAT 6-24 HRS): SARS Coronavirus 2: NEGATIVE

## 2019-11-21 NOTE — Discharge Instructions (Signed)

## 2019-11-23 ENCOUNTER — Ambulatory Visit: Payer: Medicare Other | Admitting: Anesthesiology

## 2019-11-23 ENCOUNTER — Encounter
Admission: RE | Disposition: A | Discharge status: Home / Self Care | Payer: Self-pay | Source: Home / Self Care | Attending: Ophthalmology

## 2019-11-23 ENCOUNTER — Encounter: Payer: Self-pay | Admitting: Ophthalmology

## 2019-11-23 ENCOUNTER — Ambulatory Visit
Admission: RE | Admit: 2019-11-23 | Discharge: 2019-11-23 | Disposition: A | Discharge status: Home / Self Care | Payer: Medicare Other | Attending: Ophthalmology | Admitting: Ophthalmology

## 2019-11-23 ENCOUNTER — Other Ambulatory Visit: Payer: Self-pay

## 2019-11-23 DIAGNOSIS — E039 Hypothyroidism, unspecified: Secondary | ICD-10-CM | POA: Insufficient documentation

## 2019-11-23 DIAGNOSIS — K449 Diaphragmatic hernia without obstruction or gangrene: Secondary | ICD-10-CM | POA: Diagnosis not present

## 2019-11-23 DIAGNOSIS — R42 Dizziness and giddiness: Secondary | ICD-10-CM | POA: Diagnosis not present

## 2019-11-23 DIAGNOSIS — H2512 Age-related nuclear cataract, left eye: Secondary | ICD-10-CM | POA: Diagnosis not present

## 2019-11-23 DIAGNOSIS — M1990 Unspecified osteoarthritis, unspecified site: Secondary | ICD-10-CM | POA: Insufficient documentation

## 2019-11-23 DIAGNOSIS — E7800 Pure hypercholesterolemia, unspecified: Secondary | ICD-10-CM | POA: Diagnosis not present

## 2019-11-23 DIAGNOSIS — K219 Gastro-esophageal reflux disease without esophagitis: Secondary | ICD-10-CM | POA: Insufficient documentation

## 2019-11-23 DIAGNOSIS — Z9849 Cataract extraction status, unspecified eye: Secondary | ICD-10-CM | POA: Diagnosis not present

## 2019-11-23 DIAGNOSIS — I10 Essential (primary) hypertension: Secondary | ICD-10-CM | POA: Insufficient documentation

## 2019-11-23 DIAGNOSIS — Z9049 Acquired absence of other specified parts of digestive tract: Secondary | ICD-10-CM | POA: Diagnosis not present

## 2019-11-23 HISTORY — PX: CATARACT EXTRACTION W/PHACO: SHX586

## 2019-11-23 SURGERY — PHACOEMULSIFICATION, CATARACT, WITH IOL INSERTION
Anesthesia: Monitor Anesthesia Care | Site: Eye | Laterality: Left

## 2019-11-23 MED ORDER — EPINEPHRINE PF 1 MG/ML IJ SOLN
INTRAOCULAR | Status: DC | PRN
  Administered 2019-11-23: 56 mL via OPHTHALMIC

## 2019-11-23 MED ORDER — FENTANYL CITRATE (PF) 100 MCG/2ML IJ SOLN
INTRAMUSCULAR | Status: DC | PRN
Start: 2019-11-23 — End: 2019-11-23
  Administered 2019-11-23 (×2): 50 ug via INTRAVENOUS

## 2019-11-23 MED ORDER — LIDOCAINE HCL (PF) 2 % IJ SOLN
INTRAOCULAR | Status: DC | PRN
  Administered 2019-11-23: 1 mL

## 2019-11-23 MED ORDER — LACTATED RINGERS IV SOLN: INTRAVENOUS | Status: DC

## 2019-11-23 MED ORDER — ACETAMINOPHEN 160 MG/5ML PO SOLN: 325.0000 mg | ORAL | Status: DC | PRN

## 2019-11-23 MED ORDER — TETRACAINE HCL 0.5 % OP SOLN
1.0000 [drp] | OPHTHALMIC | Status: DC | PRN
  Administered 2019-11-23 (×3): 1 [drp] via OPHTHALMIC

## 2019-11-23 MED ORDER — MOXIFLOXACIN HCL 0.5 % OP SOLN
1.0000 [drp] | OPHTHALMIC | Status: DC | PRN
  Administered 2019-11-23 (×3): 1 [drp] via OPHTHALMIC

## 2019-11-23 MED ORDER — MIDAZOLAM HCL 2 MG/2ML IJ SOLN
INTRAMUSCULAR | Status: DC | PRN
  Administered 2019-11-23: 1.5 mg via INTRAVENOUS
  Administered 2019-11-23: .5 mg via INTRAVENOUS

## 2019-11-23 MED ORDER — CEFUROXIME OPHTHALMIC INJECTION 1 MG/0.1 ML
INJECTION | OPHTHALMIC | Status: DC | PRN
  Administered 2019-11-23: 0.1 mL via INTRACAMERAL

## 2019-11-23 MED ORDER — NEOMYCIN-POLYMYXIN-DEXAMETH 3.5-10000-0.1 OP OINT
TOPICAL_OINTMENT | OPHTHALMIC | Status: DC | PRN
  Administered 2019-11-23: 1 via OPHTHALMIC

## 2019-11-23 MED ORDER — ACETAMINOPHEN 325 MG PO TABS: 650.0000 mg | ORAL_TABLET | Freq: Once | ORAL | Status: DC | PRN

## 2019-11-23 MED ORDER — BRIMONIDINE TARTRATE-TIMOLOL 0.2-0.5 % OP SOLN
OPHTHALMIC | Status: DC | PRN
  Administered 2019-11-23: 1 [drp] via OPHTHALMIC

## 2019-11-23 MED ORDER — ARMC OPHTHALMIC DILATING DROPS
1.0000 "application " | OPHTHALMIC | Status: DC | PRN
  Administered 2019-11-23 (×3): 1 via OPHTHALMIC

## 2019-11-23 MED ORDER — NA HYALUR & NA CHOND-NA HYALUR 0.4-0.35 ML IO KIT
PACK | INTRAOCULAR | Status: DC | PRN
  Administered 2019-11-23: 1 mL via INTRAOCULAR

## 2019-11-23 MED ORDER — ONDANSETRON HCL 4 MG/2ML IJ SOLN: 4.0000 mg | Freq: Once | INTRAMUSCULAR | Status: DC | PRN

## 2019-11-23 SURGICAL SUPPLY — 30 items
CANNULA ANT/CHMB 27G (MISCELLANEOUS) ×1 IMPLANT
CANNULA ANT/CHMB 27GA (MISCELLANEOUS) ×3 IMPLANT
GLOVE SURG LX 7.5 STRW (GLOVE) ×4
GLOVE SURG LX STRL 7.5 STRW (GLOVE) ×1 IMPLANT
GLOVE SURG TRIUMPH 8.0 PF LTX (GLOVE) ×3 IMPLANT
GOWN STRL REUS W/ TWL LRG LVL3 (GOWN DISPOSABLE) ×2 IMPLANT
GOWN STRL REUS W/TWL LRG LVL3 (GOWN DISPOSABLE) ×6
LENS IOL ACRSF IQ VT 15 20.0 IMPLANT
LENS IOL ACRYSOF VIVITY 20.0 ×3 IMPLANT
LENS IOL VIVITY 015 20.0 ×1 IMPLANT
MARKER SKIN DUAL TIP RULER LAB (MISCELLANEOUS) ×3 IMPLANT
NDL CAPSULORHEX 25GA (NEEDLE) ×1 IMPLANT
NDL FILTER BLUNT 18X1 1/2 (NEEDLE) ×2 IMPLANT
NDL RETROBULBAR .5 NSTRL (NEEDLE) IMPLANT
NEEDLE CAPSULORHEX 25GA (NEEDLE) ×3 IMPLANT
NEEDLE FILTER BLUNT 18X 1/2SAF (NEEDLE) ×4
NEEDLE FILTER BLUNT 18X1 1/2 (NEEDLE) ×2 IMPLANT
PACK CATARACT BRASINGTON (MISCELLANEOUS) ×3 IMPLANT
PACK EYE AFTER SURG (MISCELLANEOUS) ×3 IMPLANT
PACK OPTHALMIC (MISCELLANEOUS) ×3 IMPLANT
RING MALYGIN 7.0 (MISCELLANEOUS) IMPLANT
SOLUTION OPHTHALMIC SALT (MISCELLANEOUS) ×3 IMPLANT
SUT ETHILON 10-0 CS-B-6CS-B-6 (SUTURE)
SUT VICRYL  9 0 (SUTURE)
SUT VICRYL 9 0 (SUTURE) IMPLANT
SUTURE EHLN 10-0 CS-B-6CS-B-6 (SUTURE) IMPLANT
SYR 3ML LL SCALE MARK (SYRINGE) ×6 IMPLANT
SYR TB 1ML LUER SLIP (SYRINGE) ×3 IMPLANT
WATER STERILE IRR 250ML POUR (IV SOLUTION) ×3 IMPLANT
WIPE NON LINTING 3.25X3.25 (MISCELLANEOUS) ×3 IMPLANT

## 2019-11-23 NOTE — Op Note (Signed)
OPERATIVE NOTE  JAYLYN IYER [id number] 11/23/2019   PREOPERATIVE DIAGNOSIS:  Nuclear sclerotic cataract left eye. H25.12   POSTOPERATIVE DIAGNOSIS:    Nuclear sclerotic cataract left eye.     PROCEDURE:  Phacoemusification with posterior chamber intraocular lens placement of the left eye  Ultrasound time: Procedure(s) with comments: CATARACT EXTRACTION PHACO AND INTRAOCULAR LENS PLACEMENT (IOC) LEFT VIVITY LENS (Left) - 6.01 0:50.9 11.8%  LENS:   Implant Name Type Inv. Item Serial No. Manufacturer Lot No. LRB No. Used Action  ALCON ACRYSOF IQ VIVITY IOL Intraocular Lens  [id number] ALCON  Left 1 Implanted    [id number] 21.0 D  SURGEON:  Deirdre Evener, MD   ANESTHESIA:  Topical with tetracaine drops and 2% Xylocaine jelly, augmented with 1% preservative-free intracameral lidocaine.    COMPLICATIONS:  None.   DESCRIPTION OF PROCEDURE:  The patient was identified in the holding room and transported to the operating room and placed in the supine position under the operating microscope.  The left eye was identified as the operative eye and it was prepped and draped in the usual sterile ophthalmic fashion.   A 1 millimeter clear-corneal paracentesis was made at the 1:30 position.  0.5 ml of preservative-free 1% lidocaine was injected into the anterior chamber.  The anterior chamber was filled with Viscoat viscoelastic.  A 2.4 millimeter keratome was used to make a near-clear corneal incision at the 10:30 position.  .  A curvilinear capsulorrhexis was made with a cystotome and capsulorrhexis forceps.  Balanced salt solution was used to hydrodissect and hydrodelineate the nucleus.   Phacoemulsification was then used in stop and chop fashion to remove the lens nucleus and epinucleus.  The remaining cortex was then removed using the irrigation and aspiration handpiece. Provisc was then placed into the capsular bag to distend it for lens placement.  A lens was then injected into the  capsular bag.  The remaining viscoelastic was aspirated.   Wounds were hydrated with balanced salt solution.  The anterior chamber was inflated to a physiologic pressure with balanced salt solution.  No wound leaks were noted. Cefuroxime 0.1 ml of a 10mg /ml solution was injected into the anterior chamber for a dose of 1 mg of intracameral antibiotic at the completion of the case.   Timolol and Brimonidine drops and maxitrol ointment were applied to the eye.  The patient was taken to the recovery room in stable condition without complications of anesthesia or surgery.  Enzo Treu 11/23/2019, 12:38 PM

## 2019-11-23 NOTE — H&P (Signed)
[facility]   Primary Care Physician:  Lynnea Ferrier, MD Ophthalmologist: Dr. Lockie Mola  Pre-Procedure History & Physical: HPI:  Laurie [name] is a 79 y.o. female here for ophthalmic surgery.   Past Medical History:  Diagnosis Date  . DDD (degenerative disc disease), lumbar 12/16/2016   Overview:  status post back surgery x 3.  . DJD (degenerative joint disease) 12/16/2016  . Facet arthritis of lumbar region 09/24/2015  . GERD (gastroesophageal reflux disease) 12/16/2016  . High cholesterol 12/16/2016  . Hypertension   . Hypothyroid   . Internal hemorrhoids 12/16/2016  . Leukopenia 12/16/2016  . Microscopic hematuria 12/16/2016   Overview:  IVP, ultrasound unrevealing 2008. Status post Urology evaluation through Dr. Lonna Cobb.  . Partial small bowel obstruction (HCC) 12/11/2016  . Thyroid disorder 12/16/2016   Overview:  hypothyroidism  . Urethral discharge 05/18/2013  . 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 Urge incontinence 05/18/2013  . Vaginal lesion 06/24/2013  . Vertigo    several episodes over 6 yrs ago    Past Surgical History:  Procedure Laterality Date  . ABDOMINAL HYSTERECTOMY    . BACK SURGERY    . CATARACT EXTRACTION W/PHACO Right 10/26/2019   Procedure: CATARACT EXTRACTION PHACO AND INTRAOCULAR LENS PLACEMENT (IOC) RIGHT 5.32  00:47.9  11.1%;  Surgeon: Lockie Mola, MD;  Location: [facility];  Service: Ophthalmology;  Laterality: Right;  . CHOLECYSTECTOMY    . COLONOSCOPY WITH PROPOFOL N/A 10/18/2018   Procedure: COLONOSCOPY WITH PROPOFOL;  Surgeon: Toledo, Boykin Nearing, MD;  Location: [facility];  Service: Gastroenterology;  Laterality: N/A;  . ESOPHAGOGASTRODUODENOSCOPY (EGD) WITH PROPOFOL N/A 10/18/2018   Procedure: ESOPHAGOGASTRODUODENOSCOPY (EGD) WITH PROPOFOL;  Surgeon: Toledo, Boykin Nearing, MD;  Location: [facility];  Service: Gastroenterology;  Laterality: N/A;    Prior to Admission medications   Medication Sig Start Date  End Date Taking? Authorizing Provider  aspirin EC 81 MG tablet Take 81 mg by mouth daily.   Yes [provider]  chlorthalidone (HYGROTON) 25 MG tablet Take 25 mg by mouth daily. 08/04/18  Yes [provider]  Cholecalciferol (VITAMIN D3 PO) Take by mouth daily.   Yes [provider]  Cyanocobalamin (VITAMIN B-12 PO) Take by mouth daily.   Yes [provider]  levothyroxine (SYNTHROID, LEVOTHROID) 75 MCG tablet Take 75 mcg by mouth daily before breakfast. 30 minutes before breakfast 03/31/16  Yes [provider]  losartan (COZAAR) 50 MG tablet 25 mg.  02/21/16  Yes [provider]  MAGNESIUM PO Take by mouth daily.   Yes [provider]  Multiple Vitamins-Minerals (ZINC PO) Take by mouth daily.   Yes [provider]  rosuvastatin (CRESTOR) 10 MG tablet Take by mouth. 05/02/16 11/14/19 Yes [provider]  VITAMIN E PO Take by mouth daily.   Yes [provider]  albuterol (PROVENTIL HFA;VENTOLIN HFA) 108 (90 Base) MCG/ACT inhaler Inhale into the lungs every 6 (six) hours as needed for wheezing or shortness of breath.  09/13/18  [provider]    Allergies as of 10/28/2019 - Review Complete 10/26/2019  Allergen Reaction Noted  . Amlodipine Swelling 05/06/2013    Family History  Problem Relation Age of Onset  . Hyperlipidemia Mother   . Rectal cancer Mother   . Other Father        killed in World War II    Social History   Socioeconomic History  . Marital status: Married    Spouse name: Not on file  .  Number of children: Not on file  . Years of education: Not on file  . Highest education level: Not on file  Occupational History  . Not on file  Tobacco Use  . Smoking status: Never Smoker  . Smokeless tobacco: Never Used  Vaping Use  . Vaping Use: Never used  Substance and Sexual Activity  . Alcohol use: Yes    Alcohol/week: 3.0 standard drinks    Types: 3 Glasses of wine per week  .  Drug use: No  . Sexual activity: Not on file  Other Topics Concern  . Not on file  Social History Narrative  . Not on file   Social Determinants of Health   Financial Resource Strain:   . Difficulty of Paying Living Expenses: Not on file  Food Insecurity:   . Worried About Programme researcher, broadcasting/film/video in the Last Year: Not on file  . Ran Out of Food in the Last Year: Not on file  Transportation Needs:   . Lack of Transportation (Medical): Not on file  . Lack of Transportation (Non-Medical): Not on file  Physical Activity:   . Days of Exercise per Week: Not on file  . Minutes of Exercise per Session: Not on file  Stress:   . Feeling of Stress : Not on file  Social Connections:   . Frequency of Communication with Friends and Family: Not on file  . Frequency of Social Gatherings with Friends and Family: Not on file  . Attends Religious Services: Not on file  . Active Member of Clubs or Organizations: Not on file  . Attends Banker Meetings: Not on file  . Marital Status: Not on file  Intimate Partner Violence:   . Fear of Current or Ex-Partner: Not on file  . Emotionally Abused: Not on file  . Physically Abused: Not on file  . Sexually Abused: Not on file    Review of Systems: See HPI, otherwise negative ROS  Physical Exam: BP 124/70   Pulse (!) 56   Temp 97.7 F (36.5 C) (Temporal)   Resp 16   Ht 5\' 3"  (1.6 m)   Wt 75.8 kg   SpO2 100%   BMI 29.58 kg/m  General:   Alert,  pleasant and cooperative in NAD Head:  Normocephalic and atraumatic. Lungs:  Clear to auscultation.    Heart:  Regular rate and rhythm.   Impression/Plan: Laurie [name] is here for ophthalmic surgery.  Risks, benefits, limitations, and alternatives regarding ophthalmic surgery have been reviewed with the patient.  Questions have been answered.  All parties agreeable.   Tonny Bollman, MD  11/23/2019, 11:13 AM

## 2019-11-23 NOTE — Anesthesia Procedure Notes (Signed)
Procedure Name: MAC Performed by: Favor Hackler, CRNA Pre-anesthesia Checklist: Patient identified, Emergency Drugs available, Suction available, Timeout performed and Patient being monitored Patient Re-evaluated:Patient Re-evaluated prior to induction Oxygen Delivery Method: Nasal cannula Placement Confirmation: positive ETCO2       

## 2019-11-23 NOTE — Transfer of Care (Signed)
Immediate Anesthesia Transfer of Care Note  Patient: Laurie [name]  Procedure(s) Performed: CATARACT EXTRACTION PHACO AND INTRAOCULAR LENS PLACEMENT ([place]) LEFT VIVITY LENS (Left Eye)  Patient Location: PACU  Anesthesia Type: MAC  Level of Consciousness: awake, alert  and patient cooperative  Airway and Oxygen Therapy: Patient Spontanous Breathing and Patient connected to supplemental oxygen  Post-op Assessment: Post-op Vital signs reviewed, Patient's Cardiovascular Status Stable, Respiratory Function Stable, Patent Airway and No signs of Nausea or vomiting  Post-op Vital Signs: Reviewed and stable  Complications: No complications documented.

## 2019-11-23 NOTE — Anesthesia Postprocedure Evaluation (Signed)
Anesthesia Post Note  Patient: Laurie [name]  Procedure(s) Performed: CATARACT EXTRACTION PHACO AND INTRAOCULAR LENS PLACEMENT (IOC) LEFT VIVITY LENS (Left Eye)     Patient location during evaluation: PACU Anesthesia Type: MAC Level of consciousness: awake and alert, oriented and patient cooperative Pain management: pain level controlled Vital Signs Assessment: post-procedure vital signs reviewed and stable Respiratory status: spontaneous breathing, nonlabored ventilation and respiratory function stable Cardiovascular status: blood pressure returned to baseline and stable Postop Assessment: adequate PO intake Anesthetic complications: no   No complications documented.  Darrin Nipper

## 2021-05-15 ENCOUNTER — Other Ambulatory Visit: Payer: Self-pay | Admitting: Internal Medicine

## 2021-05-15 DIAGNOSIS — R490 Dysphonia: Secondary | ICD-10-CM

## 2021-06-03 ENCOUNTER — Ambulatory Visit
Admission: RE | Admit: 2021-06-03 | Discharge: 2021-06-03 | Disposition: A | Discharge status: Home / Self Care | Payer: Medicare Other | Source: Ambulatory Visit | Attending: Internal Medicine | Admitting: Internal Medicine

## 2021-06-03 DIAGNOSIS — R490 Dysphonia: Secondary | ICD-10-CM | POA: Diagnosis present

## 2021-06-03 LAB — POCT I-STAT CREATININE: Creatinine, Ser: 1.1 mg/dL — ABNORMAL HIGH (ref 0.44–1.00)

## 2021-06-03 MED ORDER — IOHEXOL 300 MG/ML  SOLN
75.0000 mL | Freq: Once | INTRAMUSCULAR | Status: AC | PRN
  Administered 2021-06-03: 75 mL via INTRAVENOUS

## 2021-06-18 ENCOUNTER — Telehealth: Payer: Self-pay | Admitting: Gastroenterology

## 2021-06-18 NOTE — Telephone Encounter (Signed)
Patient left vm requesting a call back. Patient had an appointment scheduled with [facility]. Patient states she does not want to go to [facility] and that she only wants to see Dr Allen Norris.  ?

## 2021-08-22 ENCOUNTER — Encounter: Payer: Self-pay | Admitting: Gastroenterology

## 2021-08-22 ENCOUNTER — Ambulatory Visit (INDEPENDENT_AMBULATORY_CARE_PROVIDER_SITE_OTHER): Payer: Medicare Other | Admitting: Gastroenterology

## 2021-08-22 VITALS — BP 130/83 | HR 92 | Temp 97.9°F | Ht 63.0 in | Wt 171.0 lb

## 2021-08-22 DIAGNOSIS — R1310 Dysphagia, unspecified: Secondary | ICD-10-CM | POA: Diagnosis not present

## 2021-08-22 DIAGNOSIS — K219 Gastro-esophageal reflux disease without esophagitis: Secondary | ICD-10-CM | POA: Diagnosis not present

## 2021-08-22 DIAGNOSIS — R9389 Abnormal findings on diagnostic imaging of other specified body structures: Secondary | ICD-10-CM

## 2021-08-22 NOTE — Progress Notes (Signed)
Gastroenterology Consultation  Referring Provider:     Lynnea Ferrier, MD Primary Care Physician:  Lynnea Ferrier, MD Primary Gastroenterologist:  Dr. Servando Snare     Reason for Consultation:     GERD        HPI:   Laurie [name] is a 81 y.o. y/o female referred for consultation & management of GERD by Dr. Graciela Husbands, Susanne Borders III, MD. This patient comes to see me after being seen in the past by Dr. Norma Fredrickson.  The patient had an EGD and colonoscopy back in 2020 with the colonoscopy not showing anything significant but the upper endoscopy showing:  - Texture changed, decreased vascular pattern, white specked mucosa in the esophagus. Biopsied. - 1 cm hiatal hernia. - Gastritis. - Normal examined duodenum. - The examination was otherwise normal.  The biopsies of the esophagus showed:  DIAGNOSIS:  A. ESOPHAGUS, PROXIMAL AND DISTAL; COLD BIOPSY:  - STRATIFIED SQUAMOUS EPITHELIUM WITHOUT EOSINOPHILS, NEUTROPHILS, OR  REACTIVE CHANGES.  - NEGATIVE FOR DYSPLASIA AND MALIGNANCY.   The patient's last visit with her PCP had mentioned esophagitis with that possibly being the cause of her hoarseness.  The patient then asked to be referred to see me.  IMPRESSION: 1. Diffuse esophageal wall thickening, which is nonspecific and may be due to underdistention but can be seen in the setting of esophagitis. Correlate with symptoms, possibly of reflux. 2. No other acute process in the neck.  The patient reports that she has more hoarseness when she sits that her computer and is not sure why.  The patient was taking pantoprazole in the morning and states that it did not help her symptoms.  She is switched due to the night and also reports that it has not helped her symptoms.  There is no report of any explain weight loss fevers chills nausea vomiting black stools or bloody stools. The patient also reports that she has dysphagia to both liquids and solids and does not notice one being worse in the  other.  Past Medical History:  Diagnosis Date   DDD (degenerative disc disease), lumbar 12/16/2016   Overview:  status post back surgery x 3.   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 Status post Urology evaluation through Dr. Lonna Cobb.   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   Urge incontinence 05/18/2013   Vaginal lesion 06/24/2013   Vertigo    several episodes over 6 yrs ago    Past Surgical History:  Procedure Laterality Date   ABDOMINAL HYSTERECTOMY     BACK SURGERY     CATARACT EXTRACTION W/PHACO Right 10/26/2019   Procedure: CATARACT EXTRACTION PHACO AND INTRAOCULAR LENS PLACEMENT (IOC) RIGHT 5.32  00:47.9  11.1%;  Surgeon: Lockie Mola, MD;  Location: [facility];  Service: Ophthalmology;  Laterality: Right;   CATARACT EXTRACTION W/PHACO Left 11/23/2019   Procedure: CATARACT EXTRACTION PHACO AND INTRAOCULAR LENS PLACEMENT (IOC) LEFT VIVITY LENS;  Surgeon: Lockie Mola, MD;  Location: [facility];  Service: Ophthalmology;  Laterality: Left;  6.01 0:50.9 11.8%   CHOLECYSTECTOMY     COLONOSCOPY  WITH PROPOFOL N/A 10/18/2018   Procedure: COLONOSCOPY WITH PROPOFOL;  Surgeon: Toledo, Boykin Nearing, MD;  Location: [facility];  Service: Gastroenterology;  Laterality: N/A;   ESOPHAGOGASTRODUODENOSCOPY (EGD) WITH PROPOFOL N/A 10/18/2018   Procedure: ESOPHAGOGASTRODUODENOSCOPY (EGD) WITH PROPOFOL;  Surgeon: Toledo, Boykin Nearing, MD;  Location: [facility];  Service: Gastroenterology;  Laterality: N/A;    Prior to Admission  medications   Medication Sig Start Date End Date Taking? Authorizing Provider  aspirin EC 81 MG tablet Take 81 mg by mouth daily.    [provider]  chlorthalidone (HYGROTON) 25 MG tablet Take 25 mg by mouth daily. 08/04/18   [provider]  Cholecalciferol (VITAMIN D3 PO) Take by mouth daily.    [provider]  Cyanocobalamin (VITAMIN B-12 PO) Take by mouth daily.    [provider]  levothyroxine (SYNTHROID, LEVOTHROID) 75 MCG tablet Take 75 mcg by mouth daily before breakfast. 30 minutes before breakfast 03/31/16   [provider]  losartan (COZAAR) 50 MG tablet 25 mg.  02/21/16   [provider]  MAGNESIUM PO Take by mouth daily.    [provider]  Multiple Vitamins-Minerals (ZINC PO) Take by mouth daily.    [provider]  rosuvastatin (CRESTOR) 10 MG tablet Take by mouth. 05/02/16 11/14/19  [provider]  VITAMIN E PO Take by mouth daily.    [provider]  albuterol (PROVENTIL HFA;VENTOLIN HFA) 108 (90 Base) MCG/ACT inhaler Inhale into the lungs every 6 (six) hours as needed for wheezing or shortness of breath.  09/13/18  [provider]    Family History  Problem Relation Age of Onset   Hyperlipidemia Mother    Rectal cancer Mother    Other Father        killed in World War II     Social History   Tobacco Use   Smoking status: Never   Smokeless tobacco: Never  Vaping Use   Vaping Use: Never used  Substance Use Topics   Alcohol use: Yes    Alcohol/week: 3.0 standard drinks of alcohol    Types: 3 Glasses of wine per week   Drug use: No    Allergies as of 08/22/2021   (No Known Allergies)    Review of Systems:    All systems reviewed and negative except where noted in HPI.   Physical Exam:  There were no vitals taken for this visit. No LMP recorded. Patient has had a hysterectomy. General:   Alert,  Well-developed, well-nourished, pleasant and cooperative in  NAD Head:  Normocephalic and atraumatic. Eyes:  Sclera clear, no icterus.   Conjunctiva pink. Ears:  Normal auditory acuity. Neck:  Supple; no masses or thyromegaly. Lungs:  Respirations even and unlabored.  Clear throughout to auscultation.   No wheezes, crackles, or rhonchi. No acute distress. Heart:  Regular rate and rhythm; no murmurs, clicks, rubs, or gallops. Abdomen:  Normal bowel sounds.  No bruits.  Soft, non-tender and non-distended without masses, hepatosplenomegaly or hernias noted.  No guarding or rebound tenderness.  Negative Carnett sign.   Rectal:  Deferred.  Pulses:  Normal pulses noted. Extremities:  No clubbing or edema.  No cyanosis. Neurologic:  Alert and oriented x3;  grossly normal neurologically. Skin:  Intact without significant lesions or rashes.  No jaundice. Lymph Nodes:  No significant cervical adenopathy. Psych:  Alert and cooperative. Normal mood and affect.  Imaging Studies: No results found.  Assessment and Plan:   Laurie [name] is a 81  y.o. y/o female who comes in today with a history of voice changes with hoarseness and EGD showing diffuse esophageal wall thickening. The patient's previous EGD did not show any inflammation on biopsies with no similar findings at that time.  The patient will be set up for an EGD and at that time will have a bravo pH probe.  The patient has been told that if the upper endoscopy and pH probe do not show a cause for her dysphagia that she may need to undergo esophageal manometry.  She has been told that we will hold off on this at the present time to see what the EGD and pH probe shows.  The patient and her husband have explained the plan and agree with it.    Midge Minium, MD. Clementeen Graham    Note: This dictation was prepared with Dragon dictation along with smaller phrase technology. Any transcriptional errors that result from this process are unintentional.

## 2021-10-03 ENCOUNTER — Ambulatory Visit: Payer: Medicare Other | Admitting: Certified Registered"

## 2021-10-03 ENCOUNTER — Ambulatory Visit
Admission: RE | Admit: 2021-10-03 | Discharge: 2021-10-03 | Disposition: A | Discharge status: Home / Self Care | Payer: Medicare Other | Source: Ambulatory Visit | Attending: Gastroenterology | Admitting: Gastroenterology

## 2021-10-03 ENCOUNTER — Encounter
Admission: RE | Disposition: A | Discharge status: Home / Self Care | Payer: Self-pay | Source: Ambulatory Visit | Attending: Gastroenterology

## 2021-10-03 DIAGNOSIS — R9389 Abnormal findings on diagnostic imaging of other specified body structures: Secondary | ICD-10-CM

## 2021-10-03 DIAGNOSIS — K219 Gastro-esophageal reflux disease without esophagitis: Secondary | ICD-10-CM | POA: Diagnosis present

## 2021-10-03 DIAGNOSIS — I10 Essential (primary) hypertension: Secondary | ICD-10-CM | POA: Diagnosis not present

## 2021-10-03 DIAGNOSIS — K449 Diaphragmatic hernia without obstruction or gangrene: Secondary | ICD-10-CM | POA: Diagnosis not present

## 2021-10-03 DIAGNOSIS — E039 Hypothyroidism, unspecified: Secondary | ICD-10-CM | POA: Diagnosis not present

## 2021-10-03 HISTORY — PX: ESOPHAGOGASTRODUODENOSCOPY: SHX5428

## 2021-10-03 SURGERY — EGD (ESOPHAGOGASTRODUODENOSCOPY)
Anesthesia: General

## 2021-10-03 MED ORDER — SODIUM CHLORIDE 0.9 % IV SOLN: INTRAVENOUS | Status: DC

## 2021-10-03 MED ORDER — PROPOFOL 10 MG/ML IV BOLUS
INTRAVENOUS | Status: DC | PRN
  Administered 2021-10-03 (×2): 50 mg via INTRAVENOUS
  Administered 2021-10-03: 100 mg via INTRAVENOUS
  Administered 2021-10-03: 20 mg via INTRAVENOUS

## 2021-10-03 MED ORDER — LIDOCAINE HCL (CARDIAC) PF 100 MG/5ML IV SOSY
PREFILLED_SYRINGE | INTRAVENOUS | Status: DC | PRN
  Administered 2021-10-03: 100 mg via INTRAVENOUS

## 2021-10-03 NOTE — Transfer of Care (Signed)
Immediate Anesthesia Transfer of Care Note  Patient: Laurie [name]  Procedure(s) Performed: ESOPHAGOGASTRODUODENOSCOPY (EGD)  Patient Location: PACU and Endoscopy Unit  Anesthesia Type:General  Level of Consciousness: drowsy  Airway & Oxygen Therapy: Patient Spontanous Breathing  Post-op Assessment: Report given to RN  Post vital signs: stable  Last Vitals:  Vitals Value Taken Time  BP    Temp    Pulse    Resp    SpO2      Last Pain:  Vitals:   10/03/21 0745  TempSrc: Temporal         Complications: No notable events documented.

## 2021-10-03 NOTE — Anesthesia Postprocedure Evaluation (Signed)
Anesthesia Post Note  Patient: Laurie [name]  Procedure(s) Performed: ESOPHAGOGASTRODUODENOSCOPY (EGD)  Patient location during evaluation: Endoscopy Anesthesia Type: General Level of consciousness: awake and alert Pain management: pain level controlled Vital Signs Assessment: post-procedure vital signs reviewed and stable Respiratory status: spontaneous breathing, nonlabored ventilation, respiratory function stable and patient connected to nasal cannula oxygen Cardiovascular status: blood pressure returned to baseline and stable Postop Assessment: no apparent nausea or vomiting Anesthetic complications: no   No notable events documented.   Last Vitals:  Vitals:   10/03/21 0932 10/03/21 0942  BP: (!) 114/94 135/75  Pulse:    Resp:    Temp:    SpO2:  98%    Last Pain:  Vitals:   10/03/21 0932  TempSrc:   PainSc: 0-No pain                 Stephanie Coup

## 2021-10-03 NOTE — H&P (Signed)
Midge Minium, MD [facility] [address] [place], [place] [address] Phone:[phone] Fax : [phone]  Primary Care Physician:  Lynnea Ferrier, MD Primary Gastroenterologist:  Dr. Servando Snare  Pre-Procedure History & Physical: HPI:  Laurie [name] is a 81 y.o. female is here for an endoscopy.   Past Medical History:  Diagnosis Date   DDD (degenerative disc disease), lumbar 12/16/2016   Overview:  status post back surgery x 3.   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 Status post Urology evaluation through Dr. Lonna Cobb.   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   Urge incontinence 05/18/2013   Vaginal lesion 06/24/2013   Vertigo    several episodes over 6 yrs ago    Past Surgical History:  Procedure Laterality Date   ABDOMINAL HYSTERECTOMY     BACK SURGERY     CATARACT EXTRACTION W/PHACO Right 10/26/2019   Procedure: CATARACT EXTRACTION PHACO AND INTRAOCULAR LENS PLACEMENT (IOC) RIGHT 5.32  00:47.9  11.1%;  Surgeon: Lockie Mola, MD;  Location: [facility];  Service: Ophthalmology;  Laterality: Right;   CATARACT EXTRACTION W/PHACO Left 11/23/2019   Procedure: CATARACT EXTRACTION PHACO AND INTRAOCULAR LENS PLACEMENT (IOC) LEFT VIVITY LENS;  Surgeon: Lockie Mola, MD;  Location: [facility];  Service: Ophthalmology;  Laterality: Left;  6.01 0:50.9 11.8%   CHOLECYSTECTOMY     COLONOSCOPY WITH PROPOFOL N/A 10/18/2018   Procedure: COLONOSCOPY WITH PROPOFOL;  Surgeon: Toledo, Boykin Nearing, MD;  Location: [facility];   Service: Gastroenterology;  Laterality: N/A;   ESOPHAGOGASTRODUODENOSCOPY (EGD) WITH PROPOFOL N/A 10/18/2018   Procedure: ESOPHAGOGASTRODUODENOSCOPY (EGD) WITH PROPOFOL;  Surgeon: Toledo, Boykin Nearing, MD;  Location: [facility];  Service: Gastroenterology;  Laterality: N/A;    Prior to Admission medications   Medication Sig Start Date End Date Taking? Authorizing Provider  chlorthalidone (HYGROTON) 25 MG tablet Take 25 mg by mouth daily. 08/04/18  Yes [provider]  levothyroxine (SYNTHROID, LEVOTHROID) 75 MCG tablet Take 75 mcg by mouth daily before breakfast. 30 minutes before breakfast 03/31/16  Yes [provider]  losartan (COZAAR) 50 MG tablet 25 mg.  02/21/16  Yes [provider]  rosuvastatin (CRESTOR) 10 MG tablet Take by mouth. 05/02/16 10/03/21 Yes [provider]  Cholecalciferol (VITAMIN D3 PO) Take by mouth daily.    [provider]  Cyanocobalamin (VITAMIN B-12 PO) Take by mouth daily.    [provider]  MAGNESIUM PO Take by mouth daily.    [provider]  pantoprazole (PROTONIX) 40 MG tablet Take 40 mg by mouth daily. 06/10/21   [provider]  VITAMIN E PO Take by mouth daily.    [provider]  albuterol (PROVENTIL HFA;VENTOLIN HFA) 108 (90 Base) MCG/ACT inhaler Inhale into the lungs every 6 (six) hours as needed for wheezing or shortness of breath.  09/13/18  [provider]    Allergies as of 08/23/2021   (No Known Allergies)    Family History  Problem Relation Age of Onset   Hyperlipidemia Mother    Rectal cancer Mother    Other Father  killed in World War II    Social History   Socioeconomic History   Marital status: Married    Spouse name: Not on file   Number of children: Not on file   Years of education: Not on file   Highest education level: Not on file  Occupational History   Not on file  Tobacco Use   Smoking status: Never   Smokeless tobacco: Never   Vaping Use   Vaping Use: Never used  Substance and Sexual Activity   Alcohol use: Yes    Alcohol/week: 3.0 standard drinks of alcohol    Types: 3 Glasses of wine per week   Drug use: No   Sexual activity: Not on file  Other Topics Concern   Not on file  Social History Narrative   Not on file   Social Determinants of Health   Financial Resource Strain: Not on file  Food Insecurity: Not on file  Transportation Needs: Not on file  Physical Activity: Not on file  Stress: Not on file  Social Connections: Not on file  Intimate Partner Violence: Not on file    Review of Systems: See HPI, otherwise negative ROS  Physical Exam: BP (!) 140/82   Pulse 61   Temp (!) 97.5 F (36.4 C) (Temporal)   Resp 18   SpO2 99%  General:   Alert,  pleasant and cooperative in NAD Head:  Normocephalic and atraumatic. Neck:  Supple; no masses or thyromegaly. Lungs:  Clear throughout to auscultation.    Heart:  Regular rate and rhythm. Abdomen:  Soft, nontender and nondistended. Normal bowel sounds, without guarding, and without rebound.   Neurologic:  Alert and  oriented x4;  grossly normal neurologically.  Impression/Plan: KRIZIA FLIGHT is here for an endoscopy to be performed for GERD  Risks, benefits, limitations, and alternatives regarding  endoscopy have been reviewed with the patient.  Questions have been answered.  All parties agreeable.   Midge Minium, MD  10/03/2021, 8:03 AM

## 2021-10-03 NOTE — Op Note (Signed)
[facility] Gastroenterology Patient Name: Laurie [name] Procedure Date: 10/03/2021 8:02 AM MRN: [id number] Account #: [id number] Date of Birth: July 27, 1940 Admit Type: Outpatient Age: 81 Room: [facility] ENDO ROOM 3 Gender: Female Note Status: Finalized Instrument Name: Upper Endoscope [id number] Procedure:             Upper GI endoscopy Indications:           Exclusion of esophageal reflux Providers:             Midge Minium MD, MD Referring MD:          Daniel Nones, MD (Referring MD) Medicines:             Propofol per Anesthesia Complications:         No immediate complications. Procedure:             Pre-Anesthesia Assessment:                        - Prior to the procedure, a History and Physical was                         performed, and patient medications and allergies were                         reviewed. The patient's tolerance of previous                         anesthesia was also reviewed. The risks and benefits                         of the procedure and the sedation options and risks                         were discussed with the patient. All questions were                         answered, and informed consent was obtained. Prior                         Anticoagulants: The patient has taken no previous                         anticoagulant or antiplatelet agents. ASA Grade                         Assessment: II - A patient with mild systemic disease.                         After reviewing the risks and benefits, the patient                         was deemed in satisfactory condition to undergo the                         procedure.                        After obtaining informed consent, the endoscope was  passed under direct vision. Throughout the procedure,                         the patient's blood pressure, pulse, and oxygen                         saturations were monitored continuously. The Endoscope                          was introduced through the mouth, and advanced to the                         second part of duodenum. The upper GI endoscopy was                         accomplished without difficulty. The patient tolerated                         the procedure well. Findings:      A 4 cm hiatal hernia was present.      The stomach was normal.      The examined duodenum was normal.      The BRAVO capsule with delivery system was introduced through the mouth       and advanced into the esophagus, such that the BRAVO pH capsule was       positioned 29 cm from the incisors, which was 6 cm proximal to the GE       junction. The BRAVO pH capsule was then deployed and attached to the       esophageal mucosa. The delivery system was then withdrawn. Endoscopy was       utilized for probe placement and diagnostic evaluation. The scope was       reinserted to evaluate placement of the BRAVO capsule. Visualization       showed the BRAVO capsule to be in an appropriate position. Impression:            - 4 cm hiatal hernia.                        - Normal stomach.                        - Normal examined duodenum.                        - The BRAVO pH capsule was deployed.                        - No specimens collected. Recommendation:        - Discharge patient to home.                        - Resume previous diet.                        - Continue present medications.                        - The patient should resume her PPI in 2 days Procedure Code(s):     --- Professional ---  93818, Esophagogastroduodenoscopy, flexible,                         transoral; diagnostic, including collection of                         specimen(s) by brushing or washing, when performed                         (separate procedure) Diagnosis Code(s):     --- Professional ---                        K44.9, Diaphragmatic hernia without obstruction or                         gangrene CPT copyright 2019  American Medical Association. All rights reserved. The codes documented in this report are preliminary and upon coder review may  be revised to meet current compliance requirements. Midge Minium MD, MD 10/03/2021 9:11:39 AM This report has been signed electronically. Number of Addenda: 0 Note Initiated On: 10/03/2021 8:02 AM Estimated Blood Loss:  Estimated blood loss: none.      [facility]

## 2021-10-03 NOTE — Anesthesia Preprocedure Evaluation (Signed)
Anesthesia Evaluation  Patient identified by MRN, date of birth, ID band Patient awake    Reviewed: Allergy & Precautions, NPO status , Patient's Chart, lab work & pertinent test results  Airway Mallampati: II  TM Distance: >3 FB Neck ROM: full    Dental  (+) Chipped   Pulmonary neg pulmonary ROS, neg shortness of breath, neg COPD,    Pulmonary exam normal        Cardiovascular hypertension, (-) Past MI and (-) CABG negative cardio ROS Normal cardiovascular exam     Neuro/Psych negative neurological ROS  negative psych ROS   GI/Hepatic Neg liver ROS, GERD  ,  Endo/Other  Hypothyroidism   Renal/GU negative Renal ROS  negative genitourinary   Musculoskeletal   Abdominal   Peds  Hematology negative hematology ROS (+)   Anesthesia Other Findings Past Medical History: 12/16/2016: DDD (degenerative disc disease), lumbar     Comment:  Overview:  status post back surgery x 3. 12/16/2016: DJD (degenerative joint disease) 09/24/2015: Facet arthritis of lumbar region 12/16/2016: GERD (gastroesophageal reflux disease) 12/16/2016: High cholesterol No date: Hypertension No date: Hypothyroid 12/16/2016: Internal hemorrhoids 12/16/2016: Leukopenia 12/16/2016: Microscopic hematuria     Comment:  Overview:  IVP, ultrasound unrevealing 2008. Status post              Urology evaluation through Dr. Lonna Cobb. 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 05/18/2013: Urge incontinence 06/24/2013: Vaginal lesion No date: Vertigo     Comment:  several episodes over 6 yrs ago  Past Surgical History: No date: ABDOMINAL HYSTERECTOMY No date: BACK SURGERY 10/26/2019: CATARACT EXTRACTION W/PHACO; Right     Comment:  Procedure: CATARACT EXTRACTION PHACO AND INTRAOCULAR               LENS PLACEMENT (IOC)  RIGHT 5.32  00:47.9  11.1%;                Surgeon: Lockie Mola, MD;  Location: [facility];  Service: Ophthalmology;  Laterality:               Right; 11/23/2019: CATARACT EXTRACTION W/PHACO; Left     Comment:  Procedure: CATARACT EXTRACTION PHACO AND INTRAOCULAR               LENS PLACEMENT (IOC) LEFT VIVITY LENS;  Surgeon:               Lockie Mola, MD;  Location: [facility];              Service: Ophthalmology;  Laterality: Left;                6.01 0:50.9 11.8% No date: CHOLECYSTECTOMY 10/18/2018: COLONOSCOPY WITH PROPOFOL; N/A     Comment:  Procedure: COLONOSCOPY WITH PROPOFOL;  Surgeon: [place],               Boykin Nearing, MD;  Location: [facility] ENDOSCOPY;  Service:               Gastroenterology;  Laterality: N/A; 10/18/2018: ESOPHAGOGASTRODUODENOSCOPY (EGD) WITH PROPOFOL; N/A     Comment:  Procedure: ESOPHAGOGASTRODUODENOSCOPY (EGD) WITH               PROPOFOL;  Surgeon: Norma Fredrickson, Boykin Nearing, MD;  Location:  [facility] ENDOSCOPY;  Service: Gastroenterology;  Laterality:               N/A;     Reproductive/Obstetrics negative OB ROS                             Anesthesia Physical Anesthesia Plan  ASA: 2  Anesthesia Plan: General   Post-op Pain Management:    Induction: Intravenous  PONV Risk Score and Plan: Propofol infusion and TIVA  Airway Management Planned: Natural Airway and Nasal Cannula  Additional Equipment:   Intra-op Plan:   Post-operative Plan:   Informed Consent: I have reviewed the patients History and Physical, chart, labs and discussed the procedure including the risks, benefits and alternatives for the proposed anesthesia with the patient or authorized representative who has indicated his/her understanding and acceptance.     Dental Advisory Given  Plan Discussed with: Anesthesiologist, CRNA and Surgeon  Anesthesia Plan Comments: (Patient consented for risks of  anesthesia including but not limited to:  - adverse reactions to medications - risk of airway placement if required - damage to eyes, teeth, lips or other oral mucosa - nerve damage due to positioning  - sore throat or hoarseness - Damage to heart, brain, nerves, lungs, other parts of body or loss of life  Patient voiced understanding.)        Anesthesia Quick Evaluation

## 2021-10-04 ENCOUNTER — Encounter: Payer: Self-pay | Admitting: Gastroenterology

## 2022-08-08 ENCOUNTER — Emergency Department
Admission: EM | Admit: 2022-08-08 | Discharge: 2022-08-08 | Disposition: A | Discharge status: Home / Self Care | Payer: Medicare Other | Attending: Emergency Medicine | Admitting: Emergency Medicine

## 2022-08-08 ENCOUNTER — Other Ambulatory Visit: Payer: Self-pay

## 2022-08-08 DIAGNOSIS — T7840XA Allergy, unspecified, initial encounter: Secondary | ICD-10-CM | POA: Diagnosis not present

## 2022-08-08 DIAGNOSIS — E039 Hypothyroidism, unspecified: Secondary | ICD-10-CM | POA: Diagnosis not present

## 2022-08-08 DIAGNOSIS — I4719 Other supraventricular tachycardia: Secondary | ICD-10-CM | POA: Diagnosis not present

## 2022-08-08 DIAGNOSIS — R0789 Other chest pain: Secondary | ICD-10-CM | POA: Diagnosis present

## 2022-08-08 LAB — TSH: TSH: 1.144 u[IU]/mL (ref 0.350–4.500)

## 2022-08-08 LAB — CBC WITH DIFFERENTIAL/PLATELET
Abs Immature Granulocytes: 0.02 10*3/uL (ref 0.00–0.07)
Basophils Absolute: 0 10*3/uL (ref 0.0–0.1)
Basophils Relative: 1 %
Eosinophils Absolute: 0.2 10*3/uL (ref 0.0–0.5)
Eosinophils Relative: 4 %
HCT: 43.9 % (ref 36.0–46.0)
Hemoglobin: 15 g/dL (ref 12.0–15.0)
Immature Granulocytes: 0 %
Lymphocytes Relative: 30 %
Lymphs Abs: 1.8 10*3/uL (ref 0.7–4.0)
MCH: 31.5 pg (ref 26.0–34.0)
MCHC: 34.2 g/dL (ref 30.0–36.0)
MCV: 92.2 fL (ref 80.0–100.0)
Monocytes Absolute: 0.5 10*3/uL (ref 0.1–1.0)
Monocytes Relative: 9 %
Neutro Abs: 3.2 10*3/uL (ref 1.7–7.7)
Neutrophils Relative %: 56 %
Platelets: 202 10*3/uL (ref 150–400)
RBC: 4.76 MIL/uL (ref 3.87–5.11)
RDW: 12.9 % (ref 11.5–15.5)
WBC: 5.8 10*3/uL (ref 4.0–10.5)
nRBC: 0 % (ref 0.0–0.2)

## 2022-08-08 LAB — COMPREHENSIVE METABOLIC PANEL
ALT: 22 U/L (ref 0–44)
AST: 28 U/L (ref 15–41)
Albumin: 4.6 g/dL (ref 3.5–5.0)
Alkaline Phosphatase: 57 U/L (ref 38–126)
Anion gap: 10 (ref 5–15)
BUN: 17 mg/dL (ref 8–23)
CO2: 28 mmol/L (ref 22–32)
Calcium: 9.5 mg/dL (ref 8.9–10.3)
Chloride: 99 mmol/L (ref 98–111)
Creatinine, Ser: 0.73 mg/dL (ref 0.44–1.00)
GFR, Estimated: >60 mL/min (ref >60)
Glucose, Bld: 109 mg/dL — ABNORMAL HIGH (ref 70–99)
Potassium: 3.8 mmol/L (ref 3.5–5.1)
Sodium: 137 mmol/L (ref 135–145)
Total Bilirubin: 1.9 mg/dL — ABNORMAL HIGH (ref 0.3–1.2)
Total Protein: 7.1 g/dL (ref 6.5–8.1)

## 2022-08-08 LAB — TROPONIN I (HIGH SENSITIVITY): Troponin I (High Sensitivity): 5 ng/L (ref <18)

## 2022-08-08 MED ORDER — LORATADINE 10 MG PO TABS
10.0000 mg | ORAL_TABLET | Freq: Every day | ORAL | Status: DC
  Administered 2022-08-08: 10 mg via ORAL
  Filled 2022-08-08: qty 1

## 2022-08-08 NOTE — ED Provider Notes (Signed)
[facility] Provider Note    Event Date/Time   First MD Initiated Contact with Patient 08/08/22 1506     (approximate)   History   Allergic Reaction   HPI  Laurie [name] is a 82 y.o. female  histor of SBO, hypothyroid    Right I became itchy red and inflamed at about 830 this morning.  Occurred just after trimming back some of her plants, and 1 particular plant called the euphobia plant.  No change in vision, the redness and discomfort in the eye is actually gone away now.    No fevers chills nausea or vomiting.  No hives or swelling, except the eye was red irritated and painful but is now improved.  She did experience a slight sensation of a little bit of an abnormal feeling in her chest after taking a Sudafed tablet.  That is also now gone away.   Physical Exam   Triage Vital Signs: ED Triage Vitals  Enc Vitals Group     BP 08/08/22 1352 (!) 148/98     Pulse Rate 08/08/22 1352 99     Resp 08/08/22 1352 16     Temp 08/08/22 1352 97.8 F (36.6 C)     Temp src --      SpO2 08/08/22 1352 97 %     Weight --      Height --      Head Circumference --      Peak Flow --      Pain Score 08/08/22 1353 2     Pain Loc --      Pain Edu? --      Excl. in GC? --     Most recent vital signs: Vitals:   08/08/22 1700 08/08/22 1800  BP: (!) 155/106 (!) 154/107  Pulse: 100 97  Resp: 20 (!) 23  Temp:    SpO2: 99% 100%     General: Awake, no distress.  CV:  Good peripheral perfusion.  Normal tones and rate Resp:  Normal effort.  Clear lungs bilateral Abd:  No distention.  Other:  On both eyes normal conjunctiva.  Extraocular movements normal.  Pupil is normal, reactive to light and accommodation.  No photophobia.  Patient reports all the symptoms of redness irritation in the right eye have completely abated   ED Results / Procedures / Treatments   Labs (all labs ordered are listed, but only abnormal results are displayed) Labs Reviewed   COMPREHENSIVE METABOLIC PANEL - Abnormal; Notable for the following components:      Result Value   Glucose, Bld 109 (*)    Total Bilirubin 1.9 (*)    All other components within normal limits  CBC WITH DIFFERENTIAL/PLATELET  TSH  TROPONIN I (HIGH SENSITIVITY)     EKG  Intpreteded by me at 1400 HR 95 QRS 70 QTc 422 Rate controlled, probable Afib   RADIOLOGY     PROCEDURES:  Critical Care performed: No  Procedures   MEDICATIONS ORDERED IN ED: Medications  loratadine (CLARITIN) tablet 10 mg (10 mg Oral Given 08/08/22 1558)     IMPRESSION / MDM / ASSESSMENT AND PLAN / ED COURSE  I reviewed the triage vital signs and the nursing notes.                              Differential diagnosis includes, but is not limited to, likely exposure to irritating plant oil or substance  causing right eye irritation.  This seems to improved she has normal examination of the external portion of the eye pain and all symptoms have abated.  Her pH in her right eye measure 7 which is appropriate.  All symptoms have gone away.  Also after taking Sudafed she started noticing a feeling of slight abnormality in her chest, she is known to have multiple PACs and perhaps even little bit of ectopic atrial tachycardia on EKGs.  Discussed with cardiologist and Dr. Kym Groom reviewed her case and also reviewed her ECGs remotely and advises it seems that this may have been an effective Sudafed.  Additionally the patient reports that she is completely asymptomatic after being observed in the ER her troponin is normal with symptoms starting at about 830 this morning shortly after the plant irritation and taking Sudafed.  Given Claritin here  Careful return precautions discussed with patient.  She will no longer keep the plant and when she does move which she will utilize gloves.  Discussed return precautions also recommendation to follow-up with cardiology for reevaluation and possible Holter type  monitoring  Patient's presentation is most consistent with acute complicated illness / injury requiring diagnostic workup.   Patient on ECG monitoring to evaluate for arrhythmia while in the ER  Comprehensive metabolic panel and CBC unremarkable, very minimally elevated bilirubin likely of no acute clinical significance noted at this time   Clinical Course as of 08/08/22 1818  Fri Aug 08, 2022  1740 CHADS vasc score 4.  [MQ]    Clinical Course User Index [MQ] Sharyn Creamer, MD     FINAL CLINICAL IMPRESSION(S) / ED DIAGNOSES   Final diagnoses:  Atrial tachycardia  Allergic reaction, initial encounter     Rx / DC Orders   ED Discharge Orders          Ordered    Ambulatory referral to Cardiology       Comments: ER follow-up for arrythmia. If you have not heard from the Cardiology office within the next 72 hours please call [phone].   08/08/22 1752             Note:  This document was prepared using Dragon voice recognition software and may include unintentional dictation errors.   Sharyn Creamer, MD 08/08/22 [phone]

## 2022-08-08 NOTE — ED Triage Notes (Addendum)
Pt c/o allergic reaction to [employer], states her right eye has been inflamed and she took allergy meds x2 today. Pt also reports chest tightness. Pt AOX4, NAD noted. No redness or swelling noted at this time. Respirations even and unlabored, lung sounds clear bilaterally.

## 2022-08-08 NOTE — Discharge Instructions (Signed)
Please return to the ER right away if you been experiencing pain in eye, vision changes redness itching drainage of pus or other concerns.  I suspect this was a reaction to the plant that you have been touching.  Please avoid it.  Please follow-up also with cardiology, we have placed a referral but you may wish to call them on Tuesday if they have not called to schedule an appointment with you by the

## 2022-08-08 NOTE — ED Notes (Signed)
Patient brought from [facility] for c/o chest heaviness from a possible allergic reaction. Patient is smiling, speaks easily.

## 2022-08-12 ENCOUNTER — Telehealth: Payer: Self-pay | Admitting: *Deleted

## 2022-08-12 ENCOUNTER — Ambulatory Visit: Payer: Medicare Other | Attending: Physician Assistant

## 2022-08-12 DIAGNOSIS — R000 Tachycardia, unspecified: Secondary | ICD-10-CM

## 2022-08-12 NOTE — Telephone Encounter (Signed)
-----   Message from Sondra Barges, PA-C sent at 08/09/2022  7:11 AM EDT ----- Please send Zio XT. Dx: atrial tachycardia. Needs appointment in 4-6 weeks with MD only to establish care.

## 2022-08-12 NOTE — Telephone Encounter (Signed)
Spoke with patient and reviewed that we sent her a monitor to wear. Advised that all instructions would be included and that if she should have any questions to give [place] a call back. She verbalized understanding with no further questions.

## 2022-08-17 DIAGNOSIS — R000 Tachycardia, unspecified: Secondary | ICD-10-CM | POA: Diagnosis not present

## 2022-09-15 ENCOUNTER — Encounter: Payer: Self-pay | Admitting: Cardiology

## 2022-09-15 ENCOUNTER — Telehealth: Payer: Self-pay | Admitting: Cardiology

## 2022-09-15 ENCOUNTER — Ambulatory Visit: Payer: Medicare Other | Attending: Cardiology | Admitting: Cardiology

## 2022-09-15 VITALS — BP 136/92 | HR 73 | Resp 96 | Ht 63.0 in | Wt 165.2 lb

## 2022-09-15 DIAGNOSIS — E7800 Pure hypercholesterolemia, unspecified: Secondary | ICD-10-CM | POA: Insufficient documentation

## 2022-09-15 DIAGNOSIS — I10 Essential (primary) hypertension: Secondary | ICD-10-CM | POA: Insufficient documentation

## 2022-09-15 DIAGNOSIS — I471 Supraventricular tachycardia, unspecified: Secondary | ICD-10-CM | POA: Insufficient documentation

## 2022-09-15 MED ORDER — METOPROLOL SUCCINATE ER 25 MG PO TB24: 25.0000 mg | ORAL_TABLET | Freq: Every day | ORAL | 0 refills | Status: DC

## 2022-09-15 NOTE — Telephone Encounter (Signed)
To CMA to clarify with MD if patient can adjust dosing

## 2022-09-15 NOTE — Telephone Encounter (Signed)
Called and spoke with the patient. She stated that she was started on Metoprolol Succinate 25 mg once daily. An hour after she took her first dose, she felt dizzy and chest pressure. Blood pressure and heart rate during this time was 120/80 and heart rate was 73. She stated that she is feeling much better now and would like to know if she should take her Losartan in the evening and Metoprolol in the morning.

## 2022-09-15 NOTE — Telephone Encounter (Signed)
Pt states she took he first dose two hours ago and she doesn't feel good. She states "my head is spinning and something is pressing on my chest"  She asked if she can take half a tablet for now or what Dr. Azucena Cecil advises.

## 2022-09-15 NOTE — Patient Instructions (Signed)
Medication Instructions:   START Metoprolol - take one tablet ( 25mg ) by mouth daily.   *If you need a refill on your cardiac medications before your next appointment, please call your pharmacy*   Lab Work:  None Ordered  If you have labs (blood work) drawn today and your tests are completely normal, you will receive your results only by: MyChart Message (if you have MyChart) OR A paper copy in the mail If you have any lab test that is abnormal or we need to change your treatment, we will call you to review the results.   Testing/Procedures:  Your physician has requested that you have an echocardiogram. Echocardiography is a painless test that uses sound waves to create images of your heart. It provides your doctor with information about the size and shape of your heart and how well your heart's chambers and valves are working. This procedure takes approximately one hour. There are no restrictions for this procedure. Please do NOT wear cologne, perfume, aftershave, or lotions (deodorant is allowed). Please arrive 15 minutes prior to your appointment time.    Follow-Up: At [facility], you and your health needs are our priority.  As part of our continuing mission to provide you with exceptional heart care, we have created designated Provider Care Teams.  These Care Teams include your primary Cardiologist (physician) and Advanced Practice Providers (APPs -  Physician Assistants and Nurse Practitioners) who all work together to provide you with the care you need, when you need it.  We recommend signing up for the patient portal called "MyChart".  Sign up information is provided on this After Visit Summary.  MyChart is used to connect with patients for Virtual Visits (Telemedicine).  Patients are able to view lab/test results, encounter notes, upcoming appointments, etc.  Non-urgent messages can be sent to your provider as well.   To learn more about what you can do with MyChart,  go to [email or web address].    Your next appointment:    After Echocardiogram  Provider:   You may see Debbe Odea, MD or one of the following Advanced Practice Providers on your designated Care Team:   Nicolasa Ducking, NP Eula Listen, PA-C Cadence Fransico Michael, PA-C Charlsie Quest, NP

## 2022-09-15 NOTE — Progress Notes (Signed)
Cardiology Office Note:    Date:  09/15/2022   ID:  Laurie [name], DOB Aug 15, 1940, MRN [id number]  PCP:  Lynnea Ferrier, MD   [facility] Providers Cardiologist:  Debbe Odea, MD     Referring MD: Lynnea Ferrier, MD   Chief Complaint  Patient presents with   New Patient (Initial Visit)    Referred by ER follow-up for arrhythmia.  Also to discuss recent Zio results.      History of Present Illness:    Laurie [name] is a 82 y.o. female with a hx of hypertension, hyperlipidemia, hypothyroidism who presents due to palpitations and SVT.  Patient initially presented to the ED last month with symptoms of itchy/inflamed right eye.  Taking Sudafed, EKG in the ED revealed sinus rhythm with PACs.  Patient had just taken Sudafed earlier.  Atrial arrhythmia deemed possibly from Sudafed effect.  Cardiac monitor was placed which she wore for 2 weeks.  Results showed paroxysmal SVT associated with patient triggered events.  Frequent PACs 9% burden also noted.  She has occasional palpitations/skipped heartbeats.  Otherwise doing okay.  Prior notes/studies Cardiac monitor 09/11/2022 paroxysmal SVT, PACs 9% burden.  Past Medical History:  Diagnosis Date   DDD (degenerative disc disease), lumbar 12/16/2016   Overview:  status post back surgery x 3.   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 Status post Urology evaluation through Dr. Lonna Cobb.   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   Urge incontinence 05/18/2013   Vaginal lesion 06/24/2013    Vertigo    several episodes over 6 yrs ago    Past Surgical History:  Procedure Laterality Date   ABDOMINAL HYSTERECTOMY     BACK SURGERY     CATARACT EXTRACTION W/PHACO Right 10/26/2019   Procedure: CATARACT EXTRACTION PHACO AND INTRAOCULAR LENS PLACEMENT (IOC) RIGHT 5.32  00:47.9  11.1%;  Surgeon: Lockie Mola, MD;  Location: [facility];  Service: Ophthalmology;  Laterality: Right;   CATARACT EXTRACTION W/PHACO Left 11/23/2019   Procedure: CATARACT EXTRACTION PHACO AND INTRAOCULAR LENS PLACEMENT (IOC) LEFT VIVITY LENS;  Surgeon: Lockie Mola, MD;  Location: [facility];  Service: Ophthalmology;  Laterality: Left;  6.01 0:50.9 11.8%   CHOLECYSTECTOMY     COLONOSCOPY WITH PROPOFOL N/A 10/18/2018   Procedure: COLONOSCOPY WITH PROPOFOL;  Surgeon: Toledo, Boykin Nearing, MD;  Location: [facility] END[facility]COPY;  Service: Gastroenterology;  Laterality: N/A;   ESOPHAGOGASTRODUODENOSCOPY N/A 10/03/2021   Procedure: ESOPHAGOGASTRODUODENOSCOPY (EGD);  Surgeon: Midge Minium, MD;  Location: [facility];  Service: Endoscopy;  Laterality: N/A;  2ND CASE PER PM Bravo study   ESOPHAGOGASTRODUODENOSCOPY (EGD) WITH PROPOFOL N/A 10/18/2018   Procedure: ESOPHAGOGASTRODUODENOSCOPY (EGD) WITH PROPOFOL;  Surgeon: Toledo, Boykin Nearing, MD;  Location: [facility] ENDOSCOPY;  Service: Gastroenterology;  Laterality: N/A;    Current Medications: Current Meds  Medication Sig   chlorthalidone (HYGROTON) 25 MG tablet Take 25 mg by mouth daily.   Cholecalciferol (VITAMIN D3 PO) Take by mouth daily.   Cyanocobalamin (VITAMIN B-12 PO) Take by mouth daily.   levothyroxine (SYNTHROID, LEVOTHROID) 75 MCG tablet Take 75 mcg by  mouth daily before breakfast. 30 minutes before breakfast   losartan (COZAAR) 50 MG tablet 25 mg.    MAGNESIUM PO Take by mouth daily.   metoprolol succinate (TOPROL XL) 25 MG 24 hr tablet Take 1 tablet (25 mg total) by mouth daily.   pantoprazole (PROTONIX) 40 MG tablet Take 40 mg by mouth  daily.   rosuvastatin (CRESTOR) 10 MG tablet Take by mouth.   VITAMIN E PO Take by mouth daily.     Allergies:   Patient has no known allergies.   Social History   Socioeconomic History   Marital status: Single    Spouse name: Not on file   Number of children: Not on file   Years of education: Not on file   Highest education level: Not on file  Occupational History   Not on file  Tobacco Use   Smoking status: Never   Smokeless tobacco: Never  Vaping Use   Vaping status: Never Used  Substance and Sexual Activity   Alcohol use: Yes    Alcohol/week: 3.0 standard drinks of alcohol    Types: 3 Glasses of wine per week   Drug use: No   Sexual activity: Not on file  Other Topics Concern   Not on file  Social History Narrative   Not on file   Social Determinants of Health   Financial Resource Strain: Low Risk  (06/12/2022)   Received from [facility], [facility]   Overall Financial Resource Strain (CARDIA)    Difficulty of Paying Living Expenses: Not hard at all  Food Insecurity: No Food Insecurity (06/12/2022)   Received from [facility], [facility]   Hunger Vital Sign    Worried About Running Out of Food in the Last Year: Never true    Ran Out of Food in the Last Year: Never true  Transportation Needs: No Transportation Needs (06/12/2022)   Received from [facility], [facility]   [facility] - Transportation    In the past 12 months, has lack of transportation kept you from medical appointments or from getting medications?: No    Lack of Transportation (Non-Medical): No  Physical Activity: Not on file  Stress: Not on file  Social Connections: Not on file     Family History: The patient's family history includes Heart disease in her maternal grandfather, maternal uncle, and maternal uncle; Hyperlipidemia in her mother; Other in her father; Rectal cancer in her  mother.  ROS:   Please see the history of present illness.     All other systems reviewed and are negative.  EKGs/Labs/Other Studies Reviewed:    The following studies were reviewed today:  EKG Interpretation Date/Time:  Monday September 15 2022 08:33:47 EDT Ventricular Rate:  73 PR Interval:    QRS Duration:  78 QT Interval:  386 QTC Calculation: 425 R Axis:   79  Text Interpretation: sinus rhythm, premature atrial complexes Confirmed by Debbe Odea ([id number]) on 09/15/2022 8:40:19 AM    Recent Labs: 08/08/2022: ALT 22; BUN 17; Creatinine, Ser 0.73; Hemoglobin 15.0; Platelets 202; Potassium 3.8; Sodium 137; TSH 1.144  Recent Lipid Panel No results found for: "CHOL", "TRIG", "HDL", "CHOLHDL", "VLDL", "LDLCALC", "LDLDIRECT"   Risk Assessment/Calculations:     HYPERTENSION CONTROL Vitals:   09/15/22 0827 09/15/22 0835  BP: (!) 134/90 (!) 136/92    The patient's blood pressure is elevated above target today.  In order to address the patient's elevated BP:  A new medication was prescribed today.            Physical Exam:    VS:  BP (!) 136/92 (BP Location: Right Arm, Patient Position: Sitting, Cuff Size: Normal)   Pulse 73   Resp (!) 96   Ht 5\' 3"  (1.6 m)   Wt 165 lb 3.2 oz (74.9 kg)   BMI 29.26 kg/m     Wt Readings from Last 3 Encounters:  09/15/22 165 lb 3.2 oz (74.9 kg)  08/22/21 171 lb (77.6 kg)  11/23/19 167 lb (75.8 kg)     GEN:  Well nourished, well developed in no acute distress HEENT: Normal NECK: No JVD; No carotid bruits CARDIAC: Irregular heartbeat, no murmurs RESPIRATORY:  Clear to auscultation without rales, wheezing or rhonchi  ABDOMEN: Soft, non-tender, non-distended MUSCULOSKELETAL:  No edema; No deformity  SKIN: Warm and dry NEUROLOGIC:  Alert and oriented x 3 PSYCHIATRIC:  Normal affect   ASSESSMENT:    1. Paroxysmal SVT (supraventricular tachycardia)   2. Primary hypertension   3. Pure hypercholesterolemia    PLAN:    In order  of problems listed above:  Paroxysmal SVT, frequent PACs.  Get echo.  Start Toprol-XL 25 mg daily.  Titrate beta-blocker as stated. Hypertension, chlorthalidone 25 mg daily, losartan 25 mg daily, Toprol-XL 25 mg daily.  Check BP at home and keep log. Hyperlipidemia, continue Crestor.  Follow-up in 6 to 8 weeks after echo      Medication Adjustments/Labs and Tests Ordered: Current medicines are reviewed at length with the patient today.  Concerns regarding medicines are outlined above.  Orders Placed This Encounter  Procedures   EKG 12-Lead   ECHOCARDIOGRAM COMPLETE   Meds ordered this encounter  Medications   metoprolol succinate (TOPROL XL) 25 MG 24 hr tablet    Sig: Take 1 tablet (25 mg total) by mouth daily.    Dispense:  90 tablet    Refill:  0    Patient Instructions  Medication Instructions:   START Metoprolol - take one tablet ( 25mg ) by mouth daily.   *If you need a refill on your cardiac medications before your next appointment, please call your pharmacy*   Lab Work:  None Ordered  If you have labs (blood work) drawn today and your tests are completely normal, you will receive your results only by: MyChart Message (if you have MyChart) OR A paper copy in the mail If you have any lab test that is abnormal or we need to change your treatment, we will call you to review the results.   Testing/Procedures:  Your physician has requested that you have an echocardiogram. Echocardiography is a painless test that uses sound waves to create images of your heart. It provides your doctor with information about the size and shape of your heart and how well your heart's chambers and valves are working. This procedure takes approximately one hour. There are no restrictions for this procedure. Please do NOT wear cologne, perfume, aftershave, or lotions (deodorant is allowed). Please arrive 15 minutes prior to your appointment time.    Follow-Up: At [facility],  you and your health needs are our priority.  As part of our continuing mission to provide you with exceptional heart care, we have created designated Provider Care Teams.  These Care Teams include your primary Cardiologist (physician) and Advanced Practice Providers (APPs -  Physician Assistants and Nurse Practitioners) who all work together to provide you with the care you need, when you  need it.  We recommend signing up for the patient portal called "MyChart".  Sign up information is provided on this After Visit Summary.  MyChart is used to connect with patients for Virtual Visits (Telemedicine).  Patients are able to view lab/test results, encounter notes, upcoming appointments, etc.  Non-urgent messages can be sent to your provider as well.   To learn more about what you can do with MyChart, go to [email or web address].    Your next appointment:    After Echocardiogram  Provider:   You may see Debbe Odea, MD or one of the following Advanced Practice Providers on your designated Care Team:   Nicolasa Ducking, NP Eula Listen, PA-C Cadence Fransico Michael, PA-C Charlsie Quest, NP   Signed, Debbe Odea, MD  09/15/2022 9:56 AM    [facility]

## 2022-09-16 MED ORDER — METOPROLOL SUCCINATE ER 25 MG PO TB24: 12.5000 mg | ORAL_TABLET | Freq: Every day | ORAL | Status: DC

## 2022-09-16 NOTE — Telephone Encounter (Signed)
Called patient to review Dr. Azucena Cecil recommendations as stated below:   "Okay to take Toprol-XL 12.5 mg instead.  Okay to take losartan in the a.m. and Toprol-XL in the p.m.  Or vice versa.  Thank you"    No answer. Left detailed message on VM - okay per DPR

## 2022-09-18 ENCOUNTER — Telehealth: Payer: Self-pay | Admitting: Cardiology

## 2022-09-18 NOTE — Telephone Encounter (Signed)
Pt called in stating her bp is 99/62 today and wants to know if its too low for her to take her metoprolol. Please advise.

## 2022-09-18 NOTE — Telephone Encounter (Signed)
Patient advised to hold metoprolol today and recheck BP before taking losartan tonight. Patient also advised to keep a log of BP readings and to notify our office is she notices a trend in her BP being lower. Pt denies feeling dizzy or lightheaded. Patient advised to drink some water and to call us with any questions or concerns.

## 2022-09-23 ENCOUNTER — Ambulatory Visit: Payer: Medicare Other | Attending: Cardiology

## 2022-09-23 DIAGNOSIS — I10 Essential (primary) hypertension: Secondary | ICD-10-CM | POA: Insufficient documentation

## 2022-09-23 DIAGNOSIS — I471 Supraventricular tachycardia, unspecified: Secondary | ICD-10-CM | POA: Insufficient documentation

## 2022-09-23 LAB — ECHOCARDIOGRAM COMPLETE
Area-P 1/2: 3.6 cm2
S' Lateral: 2.7 cm

## 2022-10-09 ENCOUNTER — Ambulatory Visit: Payer: Medicare Other | Attending: Cardiology | Admitting: Cardiology

## 2022-10-09 ENCOUNTER — Encounter: Payer: Self-pay | Admitting: Cardiology

## 2022-10-09 VITALS — BP 138/90 | HR 62 | Ht 63.0 in | Wt 165.0 lb

## 2022-10-09 DIAGNOSIS — I10 Essential (primary) hypertension: Secondary | ICD-10-CM | POA: Diagnosis not present

## 2022-10-09 DIAGNOSIS — I471 Supraventricular tachycardia, unspecified: Secondary | ICD-10-CM | POA: Diagnosis present

## 2022-10-09 DIAGNOSIS — E7800 Pure hypercholesterolemia, unspecified: Secondary | ICD-10-CM | POA: Diagnosis not present

## 2022-10-09 MED ORDER — CHLORTHALIDONE 25 MG PO TABS: 25.0000 mg | ORAL_TABLET | Freq: Every day | ORAL | 3 refills | Status: AC

## 2022-10-09 MED ORDER — METOPROLOL SUCCINATE ER 25 MG PO TB24: 12.5000 mg | ORAL_TABLET | Freq: Every day | ORAL | 3 refills | Status: AC

## 2022-10-09 MED ORDER — ROSUVASTATIN CALCIUM 10 MG PO TABS: 5.0000 mg | ORAL_TABLET | Freq: Every day | ORAL | 3 refills | Status: AC

## 2022-10-09 MED ORDER — LOSARTAN POTASSIUM 50 MG PO TABS: 25.0000 mg | ORAL_TABLET | Freq: Every day | ORAL | 3 refills | Status: AC

## 2022-10-09 NOTE — Progress Notes (Signed)
Cardiology Office Note:    Date:  10/09/2022   ID:  Laurie [name], DOB 1940/08/26, MRN [id number]  PCP:  Lynnea Ferrier, MD   [facility] Providers Cardiologist:  Debbe Odea, MD     Referring MD: Lynnea Ferrier, MD   Chief Complaint  Patient presents with   Follow-up    Discuss cardiac testing results.  Patient denies new or acute cardiac problems/concerns today.      History of Present Illness:    Laurie [name] is a 82 y.o. female with a hx of hypertension, hyperlipidemia, hypothyroidism who presents for follow-up.  Previously seen due to palpitations and SVT.  Previous cardiac monitor showed frequent PACs 9% burden, paroxysmal SVT.  Started on Toprol-XL.  Echo was obtained to evaluate any significant structural abnormalities.  Initially felt dizzy with Toprol-XL 25 mg daily, this was reduced to 12.5 mg daily.  Denies any symptoms of palpitations, dizziness resolved.  She feels well otherwise.  Prior notes/studies Cardiac monitor 09/11/2022 paroxysmal SVT, PACs 9% burden.  Past Medical History:  Diagnosis Date   DDD (degenerative disc disease), lumbar 12/16/2016   Overview:  status post back surgery x 3.   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 Status post Urology evaluation through Dr. Lonna Cobb.   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   Urge incontinence 05/18/2013   Vaginal lesion 06/24/2013   Vertigo    several episodes over 6 yrs ago    Past Surgical History:  Procedure Laterality Date   ABDOMINAL HYSTERECTOMY      BACK SURGERY     CATARACT EXTRACTION W/PHACO Right 10/26/2019   Procedure: CATARACT EXTRACTION PHACO AND INTRAOCULAR LENS PLACEMENT (IOC) RIGHT 5.32  00:47.9  11.1%;  Surgeon: Lockie Mola, MD;  [facility];  Service: Ophthalmology;  Laterality: Right;   CATARACT EXTRACTION W/PHACO Left 11/23/2019   Procedure: CATARACT EXTRACTION PHACO AND INTRAOCULAR LENS PLACEMENT (IOC) LEFT VIVITY LENS;  Surgeon: Lockie Mola, MD;  Location: [facility];  Service: Ophthalmology;  Laterality: Left;  6.01 0:50.9 11.8%   CHOLECYSTECTOMY     COLONOSCOPY WITH PROPOFOL N/A 10/18/2018   Procedure: COLONOSCOPY WITH PROPOFOL;  Surgeon: Toledo, Boykin Nearing, MD;  Location: [facility] ENDOSCOPY;  Service: Gastroenterology;  Laterality: N/A;   ESOPHAGOGASTRODUODENOSCOPY N/A 10/03/2021   Procedure: ESOPHAGOGASTRODUODENOSCOPY (EGD);  Surgeon: Midge Minium, MD;  Location: [facility];  Service: Endoscopy;  Laterality: N/A;  2ND CASE PER PM Bravo study   ESOPHAGOGASTRODUODENOSCOPY (EGD) WITH PROPOFOL N/A 10/18/2018   Procedure: ESOPHAGOGASTRODUODENOSCOPY (EGD) WITH PROPOFOL;  Surgeon: Toledo, Boykin Nearing, MD;  Location: [facility] END[facility];  Service: Gastroenterology;  Laterality: N/A;    Current Medications: Current Meds  Medication Sig   Cholecalciferol (VITAMIN D3 PO) Take by mouth daily.   Cyanocobalamin (VITAMIN B-12 PO) Take by mouth daily.   levothyroxine (SYNTHROID, LEVOTHROID) 75 MCG tablet Take 75 mcg by mouth daily before breakfast. 30 minutes before breakfast   MAGNESIUM PO Take by mouth daily.   pantoprazole (PROTONIX) 40 MG tablet Take 40 mg by mouth daily.   VITAMIN E PO Take by mouth  daily.   [DISCONTINUED] chlorthalidone (HYGROTON) 25 MG tablet Take 25 mg by mouth daily.   [DISCONTINUED] losartan (COZAAR) 50 MG tablet 25 mg.    [DISCONTINUED] metoprolol succinate (TOPROL XL) 25 MG 24 hr tablet Take 0.5 tablets (12.5 mg total) by mouth daily.   [DISCONTINUED] rosuvastatin  (CRESTOR) 10 MG tablet Take 5 mg by mouth daily. 1/2 tab every day     Allergies:   Patient has no known allergies.   Social History   Socioeconomic History   Marital status: Single    Spouse name: Not on file   Number of children: Not on file   Years of education: Not on file   Highest education level: Not on file  Occupational History   Not on file  Tobacco Use   Smoking status: Never   Smokeless tobacco: Never  Vaping Use   Vaping status: Never Used  Substance and Sexual Activity   Alcohol use: Yes    Alcohol/week: 3.0 standard drinks of alcohol    Types: 3 Glasses of wine per week   Drug use: No   Sexual activity: Not on file  Other Topics Concern   Not on file  Social History Narrative   Not on file   Social Determinants of Health   Financial Resource Strain: Low Risk  (06/12/2022)   Received from [facility], [facility]   Overall Financial Resource Strain (CARDIA)    Difficulty of Paying Living Expenses: Not hard at all  Food Insecurity: No Food Insecurity (06/12/2022)   Received from [facility], [facility]   Hunger Vital Sign    Worried About Running Out of Food in the Last Year: Never true    Ran Out of Food in the Last Year: Never true  Transportation Needs: No Transportation Needs (06/12/2022)   Received from [facility], [facility]   [facility] - Transportation    In the past 12 months, has lack of transportation kept you from medical appointments or from getting medications?: No    Lack of Transportation (Non-Medical): No  Physical Activity: Not on file  Stress: Not on file  Social Connections: Not on file     Family History: The patient's family history includes Heart disease in her maternal grandfather, maternal uncle, and maternal uncle; Hyperlipidemia in her mother; Other in her father; Rectal cancer in her mother.  ROS:   Please see the  history of present illness.     All other systems reviewed and are negative.  EKGs/Labs/Other Studies Reviewed:    The following studies were reviewed today:     EKG not obtained today  Recent Labs: 08/08/2022: ALT 22; BUN 17; Creatinine, Ser 0.73; Hemoglobin 15.0; Platelets 202; Potassium 3.8; Sodium 137; TSH 1.144  Recent Lipid Panel No results found for: "CHOL", "TRIG", "HDL", "CHOLHDL", "VLDL", "LDLCALC", "LDLDIRECT"  Outside lipid panel [employer] 05/2022 total cholesterol 175, LDL 96, triglycerides 109, HDL 57.  Risk Assessment/Calculations:              Physical Exam:    VS:  BP (!) 138/90 (BP Location: Left Arm, Patient Position: Sitting, Cuff Size: Normal)   Pulse 62   Ht 5\' 3"  (1.6 m)   Wt 165 lb (74.8 kg)   SpO2 96%   BMI 29.23 kg/m     Wt Readings from Last 3 Encounters:  10/09/22 165 lb (74.8 kg)  09/15/22 165 lb 3.2 oz (74.9 kg)  08/22/21 171 lb (77.6 kg)     GEN:  Well nourished, well developed in no acute distress HEENT: Normal NECK: No JVD; No carotid bruits CARDIAC: Irregular heartbeat, no murmurs RESPIRATORY:  Clear to auscultation without rales, wheezing or rhonchi  ABDOMEN: Soft, non-tender, non-distended MUSCULOSKELETAL:  No edema; No deformity  SKIN: Warm and dry NEUROLOGIC:  Alert and oriented x 3 PSYCHIATRIC:  Normal affect   ASSESSMENT:    1. Paroxysmal SVT (supraventricular tachycardia)   2. Primary hypertension   3. Pure hypercholesterolemia     PLAN:    In order of problems listed above:  Paroxysmal SVT, frequent PACs.  Echo 8/24 EF 55-60. Toprol-XL 12.5 mg daily.  Hypertension, systolic in the 130s, reasonable for patient's age.  History of dizziness with higher dose of metoprolol.  Continue chlorthalidone 25 mg daily, losartan 25 mg daily, Toprol-XL 12.5 mg daily.   Hyperlipidemia, cholesterol controlled.  Continue Crestor 10 mg daily.  Follow-up in 1 year.     Medication Adjustments/Labs and Tests Ordered: Current  medicines are reviewed at length with the patient today.  Concerns regarding medicines are outlined above.  No orders of the defined types were placed in this encounter.  Meds ordered this encounter  Medications   metoprolol succinate (TOPROL XL) 25 MG 24 hr tablet    Sig: Take 0.5 tablets (12.5 mg total) by mouth daily.    Dispense:  45 tablet    Refill:  3   losartan (COZAAR) 50 MG tablet    Sig: Take 0.5 tablets (25 mg total) by mouth daily.    Dispense:  45 tablet    Refill:  3   rosuvastatin (CRESTOR) 10 MG tablet    Sig: Take 0.5 tablets (5 mg total) by mouth daily.    Dispense:  45 tablet    Refill:  3   chlorthalidone (HYGROTON) 25 MG tablet    Sig: Take 1 tablet (25 mg total) by mouth daily.    Dispense:  90 tablet    Refill:  3    Patient Instructions  Medication Instructions:   Your physician recommends that you continue on your current medications as directed. Please refer to the Current Medication list given to you today.  *If you need a refill on your cardiac medications before your next appointment, please call your pharmacy*   Lab Work:  None Ordered  If you have labs (blood work) drawn today and your tests are completely normal, you will receive your results only by: MyChart Message (if you have MyChart) OR A paper copy in the mail If you have any lab test that is abnormal or we need to change your treatment, we will call you to review the results.   Testing/Procedures:  None Ordered   Follow-Up: At [facility], you and your health needs are our priority.  As part of our continuing mission to provide you with exceptional heart care, we have created designated Provider Care Teams.  These Care Teams include your primary Cardiologist (physician) and Advanced Practice Providers (APPs -  Physician Assistants and Nurse Practitioners) who all work together to provide you with the care you need, when you need it.  We recommend signing up for the  patient portal called "MyChart".  Sign up information is provided on this After Visit Summary.  MyChart is used to connect with patients for Virtual Visits (Telemedicine).  Patients are able to view lab/test results, encounter notes, upcoming appointments, etc.  Non-urgent messages can be sent to  your provider as well.   To learn more about what you can do with MyChart, go to [email or web address].    Your next appointment:   12 month(s)  Provider:   You may see Debbe Odea, MD or one of the following Advanced Practice Providers on your designated Care Team:   Nicolasa Ducking, NP Eula Listen, PA-C Cadence Fransico Michael, PA-C Charlsie Quest, NP   Signed, Debbe Odea, MD  10/09/2022 10:16 AM    [facility]

## 2022-10-09 NOTE — Patient Instructions (Signed)

## 2022-10-16 ENCOUNTER — Ambulatory Visit: Payer: Medicare Other | Admitting: Cardiovascular Disease

## 2022-11-17 ENCOUNTER — Encounter: Payer: Self-pay | Admitting: Internal Medicine

## 2022-12-18 ENCOUNTER — Ambulatory Visit: Payer: Medicare Other | Admitting: Gastroenterology

## 2022-12-18 ENCOUNTER — Encounter: Payer: Self-pay | Admitting: Gastroenterology

## 2022-12-18 VITALS — BP 143/87 | HR 105 | Temp 98.2°F | Wt 164.0 lb

## 2022-12-18 DIAGNOSIS — K219 Gastro-esophageal reflux disease without esophagitis: Secondary | ICD-10-CM

## 2022-12-18 NOTE — Progress Notes (Signed)
Primary Care Physician: Lynnea Ferrier, MD  Primary Gastroenterologist:  Dr. Midge Minium  Chief Complaint  Patient presents with   Gastroesophageal Reflux    Experiencing regurgitation, started taking Protonix BID with some relief, now she has changed to taking at bedtime with some relief    HPI: Laurie [name] is a 82 y.o. female here with a history of GERD.  The patient had an upper endoscopy in July 2023 and a Bravo pH study probe was placed at that time.  The patient had been seen by her primary care provider back in September with a report of wheezing reflux and chest pressure.  The patient was given some recommendations for her reflux and told to come back to see me for evaluation. It appears that the Bravo pH study did not make it into the chart and the results are not readily available.  The patient states that she has a baseline of chronic chest discomfort but it had gotten much worse whereupon she spoke to her PCP who told her to double up on her PPI.  The patient states that her severe pain went away but she continued to have the baseline discomfort she has always had.  Past Medical History:  Diagnosis Date   DDD (degenerative disc disease), lumbar 12/16/2016   Overview:  status post back surgery x 3.   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 Status post Urology evaluation through Dr. Lonna Cobb.   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 90 tablet 3   Cholecalciferol (VITAMIN D3 PO) Take by mouth daily.     Cyanocobalamin (VITAMIN B-12 PO) Take by mouth daily.     levothyroxine (SYNTHROID, LEVOTHROID) 75 MCG tablet Take 75 mcg by mouth daily before breakfast. 30 minutes before breakfast     losartan (COZAAR) 50 MG tablet Take 0.5 tablets (25 mg total) by mouth daily. 45 tablet 3   MAGNESIUM PO Take by mouth daily.     metoprolol succinate (TOPROL XL) 25 MG 24 hr tablet Take 0.5 tablets (12.5 mg total) by mouth daily. 45 tablet 3   pantoprazole (PROTONIX) 40 MG tablet Take 40 mg by mouth daily.     rosuvastatin (CRESTOR) 10 MG tablet Take 0.5 tablets (5 mg total) by mouth daily. 45 tablet 3   VITAMIN E PO Take by mouth daily.     No current facility-administered medications for this visit.    Allergies as of 12/18/2022   (No Known Allergies)    ROS:  General: Negative for anorexia, weight loss, fever, chills, fatigue, weakness. ENT: Negative for hoarseness, difficulty swallowing , nasal congestion. CV: Negative for chest pain, angina, palpitations, dyspnea on exertion, peripheral edema.  Respiratory: Negative for dyspnea at rest, dyspnea on exertion, cough, sputum, wheezing.  GI: See history of present illness.  GU:  Negative for dysuria, hematuria, urinary incontinence, urinary frequency, nocturnal urination.  Endo: Negative for unusual weight change.    Physical Examination:   BP (!) 143/87 (BP Location: Left Arm, Patient Position: Sitting, Cuff Size: Normal)   Pulse (!) 105   Temp 98.2 F (36.8 C) (Oral)   Wt 164 lb (74.4 kg)   BMI 29.05 kg/m   General: Well-nourished, well-developed in no acute distress.  Eyes: No icterus. Conjunctivae pink. Neuro: Alert and oriented x 3.  Grossly intact. Skin: Warm and dry, no jaundice.   Psych: Alert and cooperative,  normal mood and affect.  Labs:    Imaging Studies: No results found.  Assessment and Plan:   Laurie [name] is a 82 y.o. y/o female who comes in with a history of chest discomfort on a PPI.  The patient had a recent exacerbation that got better with the PPI twice a day.  The patient denies any other symptoms at the present time but is not sure what is causing her symptoms.  The patient's Bravo pH may have been archived and we are in touch with IT to retrieve the patient's report.  The patient will be contacted when the report is available.  The patient has been explained the plan and agrees with it.     Midge Minium, MD. Clementeen Graham    Note: This dictation was prepared with Dragon dictation along with smaller phrase technology. Any transcriptional errors that result from this process are unintentional.

## 2023-01-05 ENCOUNTER — Telehealth: Payer: Self-pay

## 2023-01-05 NOTE — Telephone Encounter (Signed)
Patient left a voicemail on the main line at 10:38am and states she called last week and has not heard back still. She states she is trying to find out if Dr Servando Snare has found out the results from the procedure she had done on 10/03/2021. She states the results were not in the system and Dr. Servando Snare were going to find out why there were not in there. She said to call her on her home and if she dont answer then her mobile.

## 2023-01-07 NOTE — Telephone Encounter (Signed)
Pt is aware that IT is still working on retrieving the study

## 2023-03-18 ENCOUNTER — Telehealth: Payer: Self-pay

## 2023-03-18 NOTE — Telephone Encounter (Signed)
Per pt is here in office. Here procedure was lost in the update back in 2023. Pt states Dr. Servando Snare explain that she would have to repeat procedure in order for him to make a decision  on what to do next she must do another procedure.  Pt sates  she wants to know who will pay for  this procedure ,she already paid for one and it was misplaced. Pt  wants a call back, pt states she has continue to ask [facility] if we have found the report and has never had a return call  [phone]

## 2023-12-29 LAB — COLOGUARD

## 2024-01-12 LAB — COLOGUARD: COLOGUARD: POSITIVE — AB

## 2024-01-18 ENCOUNTER — Telehealth: Payer: Self-pay | Admitting: Gastroenterology

## 2024-01-18 NOTE — Telephone Encounter (Signed)
 I spoke to pt... She was advised to repeat Bravo pH study after her report was lost. However her husband passed shortly after. Patient wanted to know that due to her history and a recent Cologuard coming back positive, would she be able to scheduled the repeat Bravo along with the colonoscopy? Due to her age, I will schedule appt with Grayce before scheduling if you advise it is okay to schedule both.  Please advise

## 2024-01-18 NOTE — Telephone Encounter (Signed)
 The patient called requesting to speak with [facility] regarding a procedure performed two years ago by Dr. Jinny. I informed her that Dr. Jinny is now a hospitalist and only sees inpatients at the hospital. I offered to schedule her with NP Robin for now, until Dr. Heriberto schedule becomes available.  The patient explained that Dr. Jinny had placed a clamp on her esophagus to record her activity, but the results were lost. Her provider advised her that the only option would be to repeat the procedure. She also stated that her provider was supposed to send a referral for a repeat colonoscopy. I informed her that the only referral we have on file is from last year. Because she is over 71, she would need an office visit with one of our providers before the procedure can be scheduled.  The patient requested to speak directly with Dr. Jinny regarding the prior procedure. I explained that she would not be able to speak with him, but she could speak with his nurse instead. She agreed to have the nurse contacted. She also shared that a repeat procedure had been planned previously, but her husband passed away three weeks after her last procedure, and she was unable to follow through at that time due to everything going on.

## 2024-01-20 ENCOUNTER — Other Ambulatory Visit: Payer: Self-pay

## 2024-01-20 DIAGNOSIS — K219 Gastro-esophageal reflux disease without esophagitis: Secondary | ICD-10-CM

## 2024-01-20 DIAGNOSIS — R195 Other fecal abnormalities: Secondary | ICD-10-CM

## 2024-01-20 NOTE — Telephone Encounter (Signed)
 Pt has been scheduled for colonoscopy and EGD with Bravo pH for 03/28/24... PPW mailed to pt and released to Mychart

## 2024-02-16 ENCOUNTER — Other Ambulatory Visit
Admission: RE | Admit: 2024-02-16 | Discharge: 2024-02-16 | Disposition: A | Discharge status: Home / Self Care | Source: Ambulatory Visit | Attending: Internal Medicine | Admitting: Internal Medicine

## 2024-02-16 DIAGNOSIS — R42 Dizziness and giddiness: Secondary | ICD-10-CM | POA: Insufficient documentation

## 2024-02-16 DIAGNOSIS — R55 Syncope and collapse: Secondary | ICD-10-CM | POA: Diagnosis present

## 2024-02-16 LAB — D-DIMER, QUANTITATIVE: D-Dimer, Quant: <0.27 ug{FEU}/mL (ref 0.00–0.50)

## 2024-02-16 LAB — TROPONIN T, HIGH SENSITIVITY: Troponin T High Sensitivity: <15 ng/L (ref 0–19)

## 2024-02-29 ENCOUNTER — Telehealth: Payer: Self-pay

## 2024-02-29 ENCOUNTER — Other Ambulatory Visit: Payer: Self-pay | Admitting: Student

## 2024-02-29 DIAGNOSIS — R42 Dizziness and giddiness: Secondary | ICD-10-CM

## 2024-02-29 NOTE — Telephone Encounter (Signed)
 Per voice mail Eva from [facility] states we must call [phone] for Prior auth. If you need me to call or I can go online and see if they need approval if you need me to.

## 2024-03-01 ENCOUNTER — Encounter: Payer: Self-pay | Admitting: Student

## 2024-03-08 MED ORDER — NA SULFATE-K SULFATE-MG SULF 17.5-3.13-1.6 GM/177ML PO SOLN: 1.0000 | Freq: Once | ORAL | 0 refills | Status: AC

## 2024-03-14 ENCOUNTER — Other Ambulatory Visit

## 2024-03-24 ENCOUNTER — Ambulatory Visit
Admission: RE | Admit: 2024-03-24 | Discharge: 2024-03-24 | Disposition: A | Source: Ambulatory Visit | Attending: Student

## 2024-03-24 ENCOUNTER — Other Ambulatory Visit: Payer: Self-pay | Admitting: Medical Genetics

## 2024-03-24 DIAGNOSIS — R42 Dizziness and giddiness: Secondary | ICD-10-CM

## 2024-03-24 MED ORDER — GADOPICLENOL 0.5 MMOL/ML IV SOLN
7.5000 mL | Freq: Once | INTRAVENOUS | Status: AC | PRN
  Administered 2024-03-24: 7.5 mL via INTRAVENOUS

## 2024-03-25 ENCOUNTER — Other Ambulatory Visit: Admission: RE | Admit: 2024-03-25 | Payer: Self-pay | Source: Ambulatory Visit

## 2024-03-28 ENCOUNTER — Ambulatory Visit: Admission: RE | Admit: 2024-03-28 | Source: Home / Self Care | Admitting: Gastroenterology

## 2024-03-28 ENCOUNTER — Encounter: Admission: RE | Payer: Self-pay | Source: Home / Self Care
